# Patient Record
Sex: Female | Born: 1960
Health system: Southern US, Community
[De-identification: ages and names within clinical notes are randomized; demographics above are authoritative.]

## PROBLEM LIST (undated history)

## (undated) DIAGNOSIS — K219 Gastro-esophageal reflux disease without esophagitis: Secondary | ICD-10-CM

## (undated) DIAGNOSIS — N979 Female infertility, unspecified: Secondary | ICD-10-CM

## (undated) DIAGNOSIS — T7840XA Allergy, unspecified, initial encounter: Secondary | ICD-10-CM

## (undated) HISTORY — PX: ECTOPIC PREGNANCY SURGERY: SHX613

## (undated) HISTORY — PX: CHOLECYSTECTOMY: SHX55

## (undated) HISTORY — DX: Female infertility, unspecified: N97.9

## (undated) HISTORY — DX: Allergy, unspecified, initial encounter: T78.40XA

## (undated) HISTORY — PX: REFRACTIVE SURGERY: SHX103

## (undated) HISTORY — DX: Gastro-esophageal reflux disease without esophagitis: K21.9

## (undated) HISTORY — PX: UTERINE FIBROID SURGERY: SHX826

---

## 1997-06-11 ENCOUNTER — Ambulatory Visit (HOSPITAL_COMMUNITY): Admission: RE | Admit: 1997-06-11 | Discharge: 1997-06-11 | Payer: Self-pay | Admitting: Gynecology

## 1997-10-02 ENCOUNTER — Other Ambulatory Visit: Admission: RE | Admit: 1997-10-02 | Discharge: 1997-10-02 | Payer: Self-pay | Admitting: Gynecology

## 1998-10-08 ENCOUNTER — Other Ambulatory Visit: Admission: RE | Admit: 1998-10-08 | Discharge: 1998-10-08 | Payer: Self-pay | Admitting: Gynecology

## 1999-09-03 ENCOUNTER — Other Ambulatory Visit: Admission: RE | Admit: 1999-09-03 | Discharge: 1999-09-03 | Payer: Self-pay | Admitting: Gynecology

## 2001-02-12 ENCOUNTER — Other Ambulatory Visit: Admission: RE | Admit: 2001-02-12 | Discharge: 2001-02-12 | Payer: Self-pay | Admitting: Gynecology

## 2003-05-16 ENCOUNTER — Other Ambulatory Visit: Admission: RE | Admit: 2003-05-16 | Discharge: 2003-05-16 | Payer: Self-pay | Admitting: Gynecology

## 2003-09-19 ENCOUNTER — Encounter: Admission: RE | Admit: 2003-09-19 | Discharge: 2003-09-19 | Payer: Self-pay | Admitting: Family Medicine

## 2003-09-19 ENCOUNTER — Encounter: Payer: Self-pay | Admitting: Internal Medicine

## 2005-06-24 ENCOUNTER — Other Ambulatory Visit: Admission: RE | Admit: 2005-06-24 | Discharge: 2005-06-24 | Payer: Self-pay | Admitting: Gynecology

## 2005-08-26 ENCOUNTER — Ambulatory Visit: Payer: Self-pay | Admitting: Gastroenterology

## 2006-03-24 ENCOUNTER — Ambulatory Visit: Payer: Self-pay | Admitting: Gastroenterology

## 2006-04-09 ENCOUNTER — Ambulatory Visit: Payer: Self-pay | Admitting: Gastroenterology

## 2006-06-09 ENCOUNTER — Ambulatory Visit: Payer: Self-pay | Admitting: Internal Medicine

## 2006-06-25 ENCOUNTER — Ambulatory Visit: Payer: Self-pay | Admitting: Internal Medicine

## 2006-06-25 LAB — CONVERTED CEMR LAB
Albumin: 4.1 g/dL (ref 3.5–5.2)
BUN: 7 mg/dL (ref 6–23)
Basophils Absolute: 0 10*3/uL (ref 0.0–0.1)
Cholesterol: 206 mg/dL (ref 0–200)
Creatinine, Ser: 0.9 mg/dL (ref 0.4–1.2)
Direct LDL: 126.7 mg/dL
Eosinophils Absolute: 0.1 10*3/uL (ref 0.0–0.6)
GFR calc Af Amer: 88 mL/min
HCT: 38.7 % (ref 36.0–46.0)
Hemoglobin: 13.1 g/dL (ref 12.0–15.0)
Lymphocytes Relative: 21.6 % (ref 12.0–46.0)
MCHC: 33.8 g/dL (ref 30.0–36.0)
MCV: 88.1 fL (ref 78.0–100.0)
Monocytes Absolute: 0.7 10*3/uL (ref 0.2–0.7)
Monocytes Relative: 7.9 % (ref 3.0–11.0)
Neutro Abs: 5.7 10*3/uL (ref 1.4–7.7)
Neutrophils Relative %: 69.2 % (ref 43.0–77.0)
Potassium: 4.7 meq/L (ref 3.5–5.1)
RDW: 12 % (ref 11.5–14.6)
Sodium: 149 meq/L — ABNORMAL HIGH (ref 135–145)
TSH: 2.7 microintl units/mL (ref 0.35–5.50)
Total Bilirubin: 0.8 mg/dL (ref 0.3–1.2)

## 2006-08-26 ENCOUNTER — Other Ambulatory Visit: Admission: RE | Admit: 2006-08-26 | Discharge: 2006-08-26 | Payer: Self-pay | Admitting: Gynecology

## 2006-09-21 ENCOUNTER — Encounter: Admission: RE | Admit: 2006-09-21 | Discharge: 2006-09-21 | Payer: Self-pay | Admitting: Gynecology

## 2007-05-05 ENCOUNTER — Telehealth: Payer: Self-pay | Admitting: Internal Medicine

## 2007-11-08 ENCOUNTER — Encounter: Admission: RE | Admit: 2007-11-08 | Discharge: 2007-11-08 | Payer: Self-pay | Admitting: Gynecology

## 2007-12-01 ENCOUNTER — Other Ambulatory Visit: Admission: RE | Admit: 2007-12-01 | Discharge: 2007-12-01 | Payer: Self-pay | Admitting: Gynecology

## 2008-05-07 ENCOUNTER — Encounter: Payer: Self-pay | Admitting: Internal Medicine

## 2008-08-03 ENCOUNTER — Telehealth: Payer: Self-pay | Admitting: Internal Medicine

## 2008-10-22 ENCOUNTER — Ambulatory Visit: Payer: Self-pay | Admitting: Family Medicine

## 2008-10-22 DIAGNOSIS — R5381 Other malaise: Secondary | ICD-10-CM | POA: Insufficient documentation

## 2008-10-22 DIAGNOSIS — R5383 Other fatigue: Secondary | ICD-10-CM

## 2008-10-22 DIAGNOSIS — J069 Acute upper respiratory infection, unspecified: Secondary | ICD-10-CM | POA: Insufficient documentation

## 2008-10-22 DIAGNOSIS — K219 Gastro-esophageal reflux disease without esophagitis: Secondary | ICD-10-CM | POA: Insufficient documentation

## 2008-10-25 LAB — CONVERTED CEMR LAB
ALT: 12 units/L (ref 0–35)
Alkaline Phosphatase: 50 units/L (ref 39–117)
Basophils Relative: 0.4 % (ref 0.0–3.0)
Bilirubin, Direct: 0 mg/dL (ref 0.0–0.3)
Calcium: 9.2 mg/dL (ref 8.4–10.5)
Chloride: 105 meq/L (ref 96–112)
Creatinine, Ser: 0.7 mg/dL (ref 0.4–1.2)
Eosinophils Relative: 1.1 % (ref 0.0–5.0)
Hemoglobin: 13.1 g/dL (ref 12.0–15.0)
Lymphocytes Relative: 29.9 % (ref 12.0–46.0)
MCV: 89.1 fL (ref 78.0–100.0)
Neutrophils Relative %: 57.6 % (ref 43.0–77.0)
RBC: 4.29 M/uL (ref 3.87–5.11)
Sodium: 142 meq/L (ref 135–145)
Total Protein: 7.5 g/dL (ref 6.0–8.3)
WBC: 6.5 10*3/uL (ref 4.5–10.5)

## 2009-01-02 ENCOUNTER — Ambulatory Visit: Payer: Self-pay | Admitting: Internal Medicine

## 2009-01-02 DIAGNOSIS — R1011 Right upper quadrant pain: Secondary | ICD-10-CM | POA: Insufficient documentation

## 2009-01-02 DIAGNOSIS — J310 Chronic rhinitis: Secondary | ICD-10-CM | POA: Insufficient documentation

## 2009-01-02 DIAGNOSIS — G478 Other sleep disorders: Secondary | ICD-10-CM | POA: Insufficient documentation

## 2009-01-04 ENCOUNTER — Encounter: Admission: RE | Admit: 2009-01-04 | Discharge: 2009-01-04 | Payer: Self-pay | Admitting: Internal Medicine

## 2009-01-29 ENCOUNTER — Ambulatory Visit (HOSPITAL_COMMUNITY): Admission: RE | Admit: 2009-01-29 | Discharge: 2009-01-29 | Payer: Self-pay | Admitting: Internal Medicine

## 2009-04-08 ENCOUNTER — Encounter: Payer: Self-pay | Admitting: Internal Medicine

## 2009-05-01 ENCOUNTER — Observation Stay (HOSPITAL_COMMUNITY): Admission: RE | Admit: 2009-05-01 | Discharge: 2009-05-02 | Payer: Self-pay | Admitting: General Surgery

## 2009-05-02 ENCOUNTER — Encounter (INDEPENDENT_AMBULATORY_CARE_PROVIDER_SITE_OTHER): Payer: Self-pay | Admitting: General Surgery

## 2009-08-27 ENCOUNTER — Encounter: Admission: RE | Admit: 2009-08-27 | Discharge: 2009-08-27 | Payer: Self-pay | Admitting: Gynecology

## 2010-03-29 ENCOUNTER — Encounter: Payer: Self-pay | Admitting: Family Medicine

## 2010-04-10 NOTE — Consult Note (Signed)
Summary: [facility]   Imported By: Maryln Gottron 04/23/2009 11:25:34  _____________________________________________________________________  External Attachment:    Type:   Image     Comment:   External Document

## 2010-05-01 ENCOUNTER — Encounter: Payer: Self-pay | Admitting: Internal Medicine

## 2010-05-02 ENCOUNTER — Encounter: Payer: Self-pay | Admitting: Internal Medicine

## 2010-05-02 ENCOUNTER — Ambulatory Visit (INDEPENDENT_AMBULATORY_CARE_PROVIDER_SITE_OTHER): Payer: Managed Care, Other (non HMO) | Admitting: Internal Medicine

## 2010-05-02 VITALS — BP 120/80 | HR 66 | Temp 98.7°F | Wt 122.0 lb

## 2010-05-02 DIAGNOSIS — Z1211 Encounter for screening for malignant neoplasm of colon: Secondary | ICD-10-CM | POA: Insufficient documentation

## 2010-05-02 DIAGNOSIS — K219 Gastro-esophageal reflux disease without esophagitis: Secondary | ICD-10-CM

## 2010-05-02 DIAGNOSIS — N979 Female infertility, unspecified: Secondary | ICD-10-CM

## 2010-05-02 DIAGNOSIS — Z23 Encounter for immunization: Secondary | ICD-10-CM

## 2010-05-02 DIAGNOSIS — Z7184 Encounter for health counseling related to travel: Secondary | ICD-10-CM

## 2010-05-02 MED ORDER — CIPROFLOXACIN HCL 500 MG PO TABS: 500.0000 mg | ORAL_TABLET | Freq: Two times a day (BID) | ORAL | Status: AC

## 2010-05-02 NOTE — Patient Instructions (Signed)
I will look into  The malaria prophylaxis and you should call  So we can   email or call in rx   Usually started 1-2 weeks pre travel.   Also we can give the tdap if needed 0

## 2010-05-04 ENCOUNTER — Encounter: Payer: Self-pay | Admitting: Internal Medicine

## 2010-05-04 DIAGNOSIS — N979 Female infertility, unspecified: Secondary | ICD-10-CM | POA: Insufficient documentation

## 2010-05-04 NOTE — Assessment & Plan Note (Signed)
Currently taking meds prn  And  Controlled otherwise

## 2010-05-04 NOTE — Progress Notes (Signed)
  Subjective:    Patient ID: [name] May, female    DOB: 1960-11-16, 50 y.o.   MRN: [id number]  HPI Patient comes in today  To discuss travel med issues. To go to [place]  And [place]  June 2 through 16th 2012 to stay mostly in hotels. Has read on CDC web site about rec. Unsure when had last tdap  Poss here pre electronics  ( before 2008 )  Never had hep a  Vaccine or disease. Healthy at present  ? About malaria prophylaxis.    To look into Yellow fever and  typhoid vaccine  At travel clinic. Past Medical History  Diagnosis Date  . GERD (gastroesophageal reflux disease)   . Infertility, female     Dr Chevis Pretty G1P0   Past Surgical History  Procedure Date  . Ectopic pregnancy surgery     laproscopy  . Uterine poly     reports that she has quit smoking. She does not have any smokeless tobacco history on file. She reports that she drinks alcohol. She reports that she does not use illicit drugs. family history includes Alcohol abuse in an unspecified family member; Coronary artery disease in an unspecified family member; Heart disease in her father; Pulmonary fibrosis in her mother; and Thyroid disease in her brother and sister.  There is no history of Gallbladder disease.    Review of Systems Prn meds for gerd.  Had uterine polyp removed recently per Dr Chevis Pretty.     Objective:   Physical Exam WD WN in nad  Looks healthy   Oriented x 3 and no noted deficits in memory, attention, and speech.      Assessment & Plan:  Travel  Advice   rx given for travelers diarrhea  Review of Electronic  Records show no td here  She will check her records  To see when done and call if needed Begin Twin rix series Counseled about various malaria preventions   Call near trip time about which way she wants to go.  More than 50% of visit  Was spent in counseling  15 minutes .

## 2010-05-29 LAB — CBC
HCT: 39.3 % (ref 36.0–46.0)
MCHC: 34.6 g/dL (ref 30.0–36.0)
MCV: 88.8 fL (ref 78.0–100.0)
Platelets: 171 10*3/uL (ref 150–400)
RDW: 12.5 % (ref 11.5–15.5)

## 2010-05-29 LAB — COMPREHENSIVE METABOLIC PANEL
Albumin: 4.4 g/dL (ref 3.5–5.2)
BUN: 11 mg/dL (ref 6–23)
Chloride: 102 mEq/L (ref 96–112)
Creatinine, Ser: 0.57 mg/dL (ref 0.4–1.2)
Total Bilirubin: 0.7 mg/dL (ref 0.3–1.2)
Total Protein: 7.4 g/dL (ref 6.0–8.3)

## 2010-05-29 LAB — DIFFERENTIAL
Basophils Absolute: 0 10*3/uL (ref 0.0–0.1)
Lymphocytes Relative: 27 % (ref 12–46)
Monocytes Absolute: 0.6 10*3/uL (ref 0.1–1.0)
Neutro Abs: 4.5 10*3/uL (ref 1.7–7.7)

## 2010-06-18 ENCOUNTER — Ambulatory Visit: Payer: Managed Care, Other (non HMO) | Admitting: Internal Medicine

## 2010-07-17 ENCOUNTER — Ambulatory Visit: Payer: Managed Care, Other (non HMO) | Admitting: Internal Medicine

## 2010-07-18 ENCOUNTER — Ambulatory Visit (INDEPENDENT_AMBULATORY_CARE_PROVIDER_SITE_OTHER): Payer: Managed Care, Other (non HMO) | Admitting: Internal Medicine

## 2010-07-18 DIAGNOSIS — Z23 Encounter for immunization: Secondary | ICD-10-CM

## 2010-07-25 NOTE — Assessment & Plan Note (Signed)
[facility] OFFICE NOTE   NAME:[name] May, [name] RUDY                  MRN:          [id number]  DATE:06/09/2006                            DOB:          [birth year]    CHIEF COMPLAINT:  New patient to get established with a couple of  issues.   HISTORY OF PRESENT ILLNESS:  Ms. [name] [name] May is a 50 year old, nonsmoking,  white female who comes in today for a first time visit.  Her previous  care was through [facility] and her GYN is Dr. Grover [name] May.  She also has been seen in [facility].  She is changing primary  care for a number of reasons.  She is generally well but does have a  diagnosis of GERD, diagnosed by endoscopy in April 11, 2006.  She is  now on Nexium with good response although at times does take the Nexium  p.r.n.  She does have hiatal hernia but no other unusual findings on her  exam.  The report is not available to [place] today.   History of seasonal rhinitis and hay fever, which tends to be worse in  the winter.  However, it is pretty much controlled on Zyrtec and  Nasonex.  No history of asthma.   PREVENTIVE:  She is up to date on her Pap smear and mammogram.  She does  not know when her last tetanus shot is.   PAST MEDICAL HISTORY:  See database.  Ectopic pregnancy in 1999.  She is  gravida 1, para 0.  LMP of May 06, 2006.   MEDICATIONS:  1. Nexium 40 mg a day.  2. Zyrtec as needed.  3. Nasonex as needed.   DRUG INTOLERANCES:  ERYTHROMYCIN.   FAMILY HISTORY:  Father had a CABG x4, died at age 41.  Mother died in  04/11/2006 of idiopathic pulmonary fibrosis, age 44.  A maternal  grandmother had possibly cervical cancer.  Father had problems with  alcohol.   SOCIAL HISTORY:  Household of four.  Rare alcohol.  No tobacco.  Two  caffeinated beverages a day.  Trying to eat more healthy and exercise.  Pets:  Two cats and a dog.  See database.  Six to eight hours of  sleep.   REVIEW OF SYSTEMS:  Negative for chest pain, shortness of breath.  She  does have a history of recurrent ovarian cysts and has fibroids, which  sometimes are problematic.  Otherwise, rest as per HPI.   OBJECTIVE:  VITAL SIGNS:  Height 5 feet 1.5 inches.  Weight 125.  Pulse  66 and regular.  Blood pressure 100/60.  GENERAL:  A healthy-appearing lady in no acute distress.  HEENT:  Grossly normal with minimal congestion.  NECK:  Supple without mass, thyromegaly, or bruits.  CHEST:  Clear to auscultation and percussion.  Breath sounds equal.  CARDIAC:  S1, S2.  No gallops or murmurs are heard.  EXTREMITIES:  Peripheral pulses present without delay.  Negative  cyanosis, clubbing, or edema.  ABDOMEN:  Soft.  No hepatosplenomegaly, guarding or rebound.  NEUROLOGIC:  Appears  grossly intact.   IMPRESSION:  1. Gastroesophageal reflux disease, controlled on medication.  Can      refill her Nexium and discuss other options such as an H2      antagonist and lifestyle changes.  2. Allergic rhinitis.  Discuss options for this treatment and we will      refill her Nasonex.  3. Family history of cardiac disease and some questionable history of      elevated lipids in her personally.  No records.  We will get today.      We will check lipids with CPX lab, report these to her and      intervene as appropriate.  Reviewed healthy lifestyles to continue.      She is to get a Tdap booster today.  Follow up other needs as      appropriate.   ADDITIONAL HISTORY:  She does have a history of irritable bowel  syndrome.     Neta Mends. Panosh, MD  Electronically Signed    WKP/MedQ  DD: 06/09/2006  DT: 06/10/2006  Job #: [id number]

## 2013-04-07 ENCOUNTER — Ambulatory Visit: Payer: Managed Care, Other (non HMO) | Admitting: Sports Medicine

## 2014-10-01 LAB — HM PAP SMEAR: HM Pap smear: NEGATIVE

## 2016-05-18 ENCOUNTER — Ambulatory Visit: Payer: Managed Care, Other (non HMO) | Admitting: Physician Assistant

## 2016-05-19 ENCOUNTER — Ambulatory Visit (INDEPENDENT_AMBULATORY_CARE_PROVIDER_SITE_OTHER): Payer: Commercial Managed Care - PPO | Admitting: Physician Assistant

## 2016-05-19 ENCOUNTER — Ambulatory Visit (INDEPENDENT_AMBULATORY_CARE_PROVIDER_SITE_OTHER): Payer: Commercial Managed Care - PPO

## 2016-05-19 ENCOUNTER — Ambulatory Visit (INDEPENDENT_AMBULATORY_CARE_PROVIDER_SITE_OTHER): Payer: Commercial Managed Care - PPO | Admitting: Sports Medicine

## 2016-05-19 ENCOUNTER — Encounter: Payer: Self-pay | Admitting: Physician Assistant

## 2016-05-19 VITALS — BP 147/77 | HR 68 | Wt 130.0 lb

## 2016-05-19 DIAGNOSIS — R030 Elevated blood-pressure reading, without diagnosis of hypertension: Secondary | ICD-10-CM

## 2016-05-19 DIAGNOSIS — M25551 Pain in right hip: Secondary | ICD-10-CM

## 2016-05-19 DIAGNOSIS — G8929 Other chronic pain: Secondary | ICD-10-CM

## 2016-05-19 DIAGNOSIS — Z0000 Encounter for general adult medical examination without abnormal findings: Secondary | ICD-10-CM

## 2016-05-19 DIAGNOSIS — M545 Low back pain: Secondary | ICD-10-CM

## 2016-05-19 DIAGNOSIS — M25552 Pain in left hip: Secondary | ICD-10-CM | POA: Insufficient documentation

## 2016-05-19 MED ORDER — MELOXICAM 15 MG PO TABS
ORAL_TABLET | ORAL | 3 refills | Status: DC
Start: 2016-05-19 — End: 2017-10-21

## 2016-05-19 NOTE — Assessment & Plan Note (Signed)
Vague hip pain, likely due to lumbar spondylosis versus femoral neck stress injury. X-rays of the lumbar spine, hip, meloxicam, lumbar spine rehabilitation exercises given. If no better in one month we will proceed with MRI of the lumbar spine and the hip.

## 2016-05-19 NOTE — Patient Instructions (Addendum)
Physical Activity Recommendations for modifying lipids and lowering blood pressure Engage in aerobic physical activity to reduce LDL-cholesterol, non-HDL-cholesterol, and blood pressure  Frequency: 3-4 sessions per week  Intensity: moderate to vigorous  Duration: 40 minutes on average  Physical Activity Recommendations for secondary prevention 1. Aerobic exercise  Frequency: 3-5 sessions per week  Intensity: 50-80% capacity  Duration: 20 - 60 minutes  Examples: walking, treadmill, cycling, rowing, stair climbing, and arm/leg ergometry  2. Resistance exercise  Frequency: 2-3 sessions per week  Intensity: 10-15 repetitions/set to moderate fatigue  Duration: 1-3 sets of 8-10 upper and lower body exercises  Examples: calisthenics, elastic bands, cuff/hand weights, dumbbels, free weights, wall pulleys, and weight machines  Heart-Healthy Lifestyle  Eating a diet rich in vegetables, fruits and whole grains: also includes low-fat dairy products, poultry, fish, legumes, and nuts; limit intake of sweets, sugar-sweetened beverages and red meats  Getting regular exercise  Maintaining a healthy weight  Not smoking or getting help quitting  Staying on top of your health; for some people, lifestyle changes alone may not be enough to prevent a heart attack or stroke. In these cases, taking a statin at the right dose will most likely be necessary   How to Take Your Blood Pressure Blood pressure is a measurement of how strongly your blood is pressing against the walls of your arteries. Arteries are blood vessels that carry blood from your heart throughout your body. Your health care provider takes your blood pressure at each office visit. You can also take your own blood pressure at home with a blood pressure machine. You may need to take your own blood pressure:  To confirm a diagnosis of high blood pressure (hypertension).  To monitor your blood pressure over time.  To make sure  your blood pressure medicine is working. Supplies needed: To take your blood pressure, you will need a blood pressure machine. You can buy a blood pressure machine, or blood pressure monitor, at most drugstores or online. There are several types of home blood pressure monitors. When choosing one, consider the following:  Choose a monitor that has an arm cuff.  Choose a monitor that wraps snugly around your upper arm. You should be able to fit only one finger between your arm and the cuff.  Do not choose a monitor that measures your blood pressure from your wrist or finger. Your health care provider can suggest a reliable monitor that will meet your needs. How to prepare To get the most accurate reading, avoid the following for 30 minutes before you check your blood pressure:  Drinking caffeine.  Drinking alcohol.  Eating.  Smoking.  Exercising. Five minutes before you check your blood pressure:  Empty your bladder.  Sit quietly without talking in a dining chair, rather than in a soft couch or armchair. How to take your blood pressure To check your blood pressure, follow the instructions in the manual that came with your blood pressure monitor. If you have a digital blood pressure monitor, the instructions may be as follows: 1. Sit up straight. 2. Place your feet on the floor. Do not cross your ankles or legs. 3. Rest your left arm at the level of your heart on a table or desk or on the arm of a chair. 4. Pull up your shirt sleeve. 5. Wrap the blood pressure cuff around the upper part of your left arm, 1 inch (2.5 cm) above your elbow. It is best to wrap the cuff around bare skin. 6.  Fit the cuff snugly around your arm. You should be able to place only one finger between the cuff and your arm. 7. Position the cord inside the groove of your elbow. 8. Press the power button. 9. Sit quietly while the cuff inflates and deflates. 10. Read the digital reading on the monitor screen and  write it down (record it). 11. Wait 2-3 minutes, then repeat the steps, starting at step 1. What does my blood pressure reading mean? A blood pressure reading consists of a higher number over a lower number. Ideally, your blood pressure should be below 120/80. The first ("top") number is called the systolic pressure. It is a measure of the pressure in your arteries as your heart beats. The second ("bottom") number is called the diastolic pressure. It is a measure of the pressure in your arteries as the heart relaxes. Blood pressure is classified into four stages. The following are the stages for adults who do not have a short-term serious illness or a chronic condition. Systolic pressure and diastolic pressure are measured in a unit called mm Hg. Normal   Systolic pressure: below 416.  Diastolic pressure: below 80. Elevated   Systolic pressure: 606-301.  Diastolic pressure: below 80. Hypertension stage 1   Systolic pressure: 601-093.  Diastolic pressure: 23-55. Hypertension stage 2   Systolic pressure: 732 or above.  Diastolic pressure: 90 or above. You can have prehypertension or hypertension even if only the systolic or only the diastolic number in your reading is higher than normal. Follow these instructions at home:  Check your blood pressure as often as recommended by your health care provider.  Take your monitor to the next appointment with your health care provider to make sure:  That you are using it correctly.  That it provides accurate readings.  Be sure you understand what your goal blood pressure numbers are.  Tell your health care provider if you are having any side effects from blood pressure medicine. Contact a health care provider if:  Your blood pressure is consistently high. Get help right away if:  Your systolic blood pressure is higher than 180.  Your diastolic blood pressure is higher than 110. This information is not intended to replace advice  given to you by your health care provider. Make sure you discuss any questions you have with your health care provider. Document Released: 08/02/2015 Document Revised: 10/15/2015 Document Reviewed: 08/02/2015 Elsevier Interactive Patient Education  2017 [employer].

## 2016-05-19 NOTE — Progress Notes (Signed)
   Subjective:    I'm seeing this patient as a consultation for:  Nelson Chimes, PA-C  CC: Right hip pain  HPI: This is a pleasant 56 year old female, for months she's had vague pain in her right back, lateral hip. No mechanical symptoms, no trauma, no constitutional symptoms. Pain is moderate, persistent. Radiates to the lateral thigh but not past the knee. Nothing overtly radicular. Worse with sitting and driving for a long period of time, not necessarily worse with Valsalva, no saddle numbness, no bowel or bladder dysfunction.  Past medical history:  Negative.  See flowsheet/record as well for more information.  Surgical history: Negative.  See flowsheet/record as well for more information.  Family history: Negative.  See flowsheet/record as well for more information.  Social history: Negative.  See flowsheet/record as well for more information.  Allergies, and medications have been entered into the medical record, reviewed, and no changes needed.   Review of Systems: No headache, visual changes, nausea, vomiting, diarrhea, constipation, dizziness, abdominal pain, skin rash, fevers, chills, night sweats, weight loss, swollen lymph nodes, body aches, joint swelling, muscle aches, chest pain, shortness of breath, mood changes, visual or auditory hallucinations.   Objective:   General: Well Developed, well nourished, and in no acute distress.  Neuro/Psych: Alert and oriented x3, extra-ocular muscles intact, able to move all 4 extremities, sensation grossly intact. Skin: Warm and dry, no rashes noted.  Respiratory: Not using accessory muscles, speaking in full sentences, trachea midline.  Cardiovascular: Pulses palpable, no extremity edema. Abdomen: Does not appear distended. Right Hip: ROM IR: 60 Deg, ER: 60 Deg, Flexion: 120 Deg, Extension: 100 Deg, Abduction: 45 Deg, Adduction: 45 Deg Strength IR: 5/5, ER: 5/5, Flexion: 5/5, Extension: 5/5, Abduction: 5/5, Adduction: 5/5 Pelvic  alignment unremarkable to inspection and palpation. Standing hip rotation and gait without trendelenburg / unsteadiness. Greater trochanter without tenderness to palpation. No tenderness over piriformis. No SI joint tenderness and normal minimal SI movement. Minimal pain with percussion of the heel and the right Back Exam:  Inspection: Unremarkable  Motion: Flexion 45 deg, Extension 45 deg, Side Bending to 45 deg bilaterally,  Rotation to 45 deg bilaterally  SLR laying: Negative  XSLR laying: Negative  Palpable tenderness: None. FABER: negative. Sensory change: Gross sensation intact to all lumbar and sacral dermatomes.  Reflexes: 2+ at both patellar tendons, 2+ at achilles tendons, Babinski's downgoing.  Strength at foot  Plantar-flexion: 5/5 Dorsi-flexion: 5/5 Eversion: 5/5 Inversion: 5/5  Leg strength  Quad: 5/5 Hamstring: 5/5 Hip flexor: 5/5 Hip abductors: 5/5  Gait unremarkable.  Impression and Recommendations:   This case required medical decision making of moderate complexity.  Right hip pain Vague hip pain, likely due to lumbar spondylosis versus femoral neck stress injury. X-rays of the lumbar spine, hip, meloxicam, lumbar spine rehabilitation exercises given. If no better in one month we will proceed with MRI of the lumbar spine and the hip.

## 2016-05-19 NOTE — Progress Notes (Signed)
HPI:                                                                [name] May is a 56 y.o. female who presents to [facility] today to establish care   Current Concerns include right hip pain  Hip pain: began approx 1 year. Gradual onset. Pain is daily and described as "uncomfortable." Worse with certain motions such as crossing her legs and driving. Improves with walking. She has tried occasional Ibuprofen. No known injury or trauma.   Patient reports Mammogram, Pap, and Tdap are up to date. She has never had colon cancer screening.  Health Maintenance Health Maintenance  Topic Date Due  . Hepatitis C Screening  1961-01-14  . HIV Screening  01/02/1978  . TETANUS/TDAP  01/02/1982  . PAP SMEAR  01/03/1984  . MAMMOGRAM  01/02/2013  . COLONOSCOPY  01/02/2013  . INFLUENZA VACCINE  10/08/2015    [facility]  Menstrual status: having periods  LMP: 05/07/16  Menses: regular  Last pap smear: 2016  History of abnormal pap smears: ASCUS x 1  Sexually active: yes  Current contraception: vasectomy  Health Habits  Diet: good  Exercise: pilates and walking  ETOH: yes, socially  Tobacco: no, smoked cigarettes as a teenager  Drugs: no   Past Medical History:  Diagnosis Date  . GERD (gastroesophageal reflux disease)   . Hyperlipidemia   . Infertility, female    Dr Carren Rang G1P0   Past Surgical History:  Procedure Laterality Date  . CHOLECYSTECTOMY    . ECTOPIC PREGNANCY SURGERY     laproscopy  . UTERINE FIBROID SURGERY     polyp   Social History  Substance Use Topics  . Smoking status: Former Smoker    Packs/day: 0.25    Years: 2.00    Types: Cigarettes    Quit date: 05/20/1983  . Smokeless tobacco: Never Used  . Alcohol use Yes   family history includes Heart disease in her father; Pulmonary fibrosis in her mother; Thyroid disease in her brother and sister.  ROS: negative  except as noted in the HPI  Medications: Current Outpatient Prescriptions  Medication Sig Dispense Refill  . diphenhydrAMINE (BENADRYL) 25 mg capsule Take 25 mg by mouth at bedtime as needed.    . Melatonin 1 MG TABS Take 1 mg by mouth at bedtime.    . Multiple Vitamin (MULTIVITAMIN) capsule Take 1 capsule by mouth daily.    . meloxicam (MOBIC) 15 MG tablet One tab PO qAM with breakfast for 2 weeks, then daily prn pain. 30 tablet 3   No current facility-administered medications for this visit.    No Known Allergies     Objective:  BP (!) 147/77 (BP Location: Right Arm, Cuff Size: Normal)   Pulse 68   Wt 130 lb (59 kg)   LMP 05/07/2016   BMI 24.17 kg/m  Gen: well-groomed, cooperative, not ill-appearing, no distress HEENT: normal conjunctiva, TM's clear, oropharynx clear, moist mucus membranes, no thyromegaly or tenderness Pulm: Normal work of breathing, clear to auscultation bilaterally CV: Normal rate, regular rhythm, s1 and s2 distinct, no murmurs, clicks or rubs appreciated on this exam, no carotid bruit GI: soft, nondistended, nontender, no masses  Neuro: alert and oriented x 3, EOM's intact, PERRLA, DTR's intact MSK: strength 5/5 and symmetric, normal gait and station, distal pulses intact, no peripheral edema Skin: warm and dry, no rashes or lesions on exposed skin Psych: normal affect, pleasant mood, normal speech and thought content  Depression screen PHQ 2/9 05/19/2016  Decreased Interest 0  Down, Depressed, Hopeless 0  PHQ - 2 Score 0      Assessment and Plan: 56 y.o. female with  1. Encounter for routine history and physical exam in female - CBC - COMPLETE METABOLIC PANEL WITH GFR - Lipid Panel w/reflex Direct LDL - Hepatitis C antibody - Mammogram and Pap up to date: patient requesting outside records - discussed Cologuard. Patient is interested. Wants to contact insurance company about copay  2. Elevated blood pressure reading - risk factors include  hyperlipidemia. Family hx of heart disease in her father. Nonsmoker, no diabetes. She is asymptomatic. - instructed on how to check BP's at home - limit salt. DASH eating plan - follow-up in 4 weeks with home BP readings  3. Chronic right hip pain - consulted Dr. Dianah Field, Sports Medicine (see note)  Patient education and anticipatory guidance given Patient agrees with treatment plan Follow-up in 4 weeks for blood pressure or sooner as needed  Darlyne Russian PA-C

## 2016-05-20 ENCOUNTER — Encounter: Payer: Self-pay | Admitting: Physician Assistant

## 2016-05-20 DIAGNOSIS — E782 Mixed hyperlipidemia: Secondary | ICD-10-CM | POA: Insufficient documentation

## 2016-05-20 LAB — LIPID PANEL W/REFLEX DIRECT LDL
CHOL/HDL RATIO: 3.5 ratio (ref <5.0)
CHOLESTEROL: 209 mg/dL — AB (ref <200)
HDL: 60 mg/dL (ref >50)
LDL-Cholesterol: 116 mg/dL — ABNORMAL HIGH
Non-HDL Cholesterol (Calc): 149 mg/dL — ABNORMAL HIGH (ref <130)
Triglycerides: 211 mg/dL — ABNORMAL HIGH (ref <150)

## 2016-05-20 LAB — CBC
HEMATOCRIT: 39.9 % (ref 35.0–45.0)
HEMOGLOBIN: 12.9 g/dL (ref 11.7–15.5)
MCH: 29.1 pg (ref 27.0–33.0)
MCHC: 32.3 g/dL (ref 32.0–36.0)
MCV: 90.1 fL (ref 80.0–100.0)
MPV: 11.4 fL (ref 7.5–12.5)
Platelets: 215 10*3/uL (ref 140–400)
RBC: 4.43 MIL/uL (ref 3.80–5.10)
RDW: 12.7 % (ref 11.0–15.0)
WBC: 6.1 10*3/uL (ref 3.8–10.8)

## 2016-05-20 LAB — HEPATITIS C ANTIBODY: HCV Ab: NEGATIVE

## 2016-05-20 LAB — COMPLETE METABOLIC PANEL WITH GFR
ALBUMIN: 4.3 g/dL (ref 3.6–5.1)
ALK PHOS: 48 U/L (ref 33–130)
ALT: 14 U/L (ref 6–29)
AST: 14 U/L (ref 10–35)
BILIRUBIN TOTAL: 0.3 mg/dL (ref 0.2–1.2)
BUN: 13 mg/dL (ref 7–25)
CALCIUM: 9.4 mg/dL (ref 8.6–10.4)
CO2: 27 mmol/L (ref 20–31)
Chloride: 102 mmol/L (ref 98–110)
Creat: 0.75 mg/dL (ref 0.50–1.05)
GFR, Est African American: >89 mL/min (ref >=60)
Glucose, Bld: 82 mg/dL (ref 65–99)
POTASSIUM: 3.8 mmol/L (ref 3.5–5.3)
Sodium: 138 mmol/L (ref 135–146)
TOTAL PROTEIN: 7.2 g/dL (ref 6.1–8.1)

## 2016-06-16 ENCOUNTER — Ambulatory Visit: Payer: Commercial Managed Care - PPO | Admitting: Physician Assistant

## 2016-06-16 ENCOUNTER — Ambulatory Visit: Payer: Commercial Managed Care - PPO | Admitting: Sports Medicine

## 2016-09-07 DIAGNOSIS — L821 Other seborrheic keratosis: Secondary | ICD-10-CM | POA: Diagnosis not present

## 2016-09-07 DIAGNOSIS — D485 Neoplasm of uncertain behavior of skin: Secondary | ICD-10-CM | POA: Diagnosis not present

## 2017-10-21 ENCOUNTER — Ambulatory Visit (INDEPENDENT_AMBULATORY_CARE_PROVIDER_SITE_OTHER): Payer: Commercial Managed Care - PPO | Admitting: Physician Assistant

## 2017-10-21 ENCOUNTER — Encounter: Payer: Self-pay | Admitting: Physician Assistant

## 2017-10-21 VITALS — BP 116/70 | HR 63 | Resp 14 | Wt 123.0 lb

## 2017-10-21 DIAGNOSIS — Z130 Encounter for screening for diseases of the blood and blood-forming organs and certain disorders involving the immune mechanism: Secondary | ICD-10-CM | POA: Diagnosis not present

## 2017-10-21 DIAGNOSIS — Z1231 Encounter for screening mammogram for malignant neoplasm of breast: Secondary | ICD-10-CM

## 2017-10-21 DIAGNOSIS — Z131 Encounter for screening for diabetes mellitus: Secondary | ICD-10-CM

## 2017-10-21 DIAGNOSIS — Z13220 Encounter for screening for lipoid disorders: Secondary | ICD-10-CM | POA: Diagnosis not present

## 2017-10-21 DIAGNOSIS — Z1329 Encounter for screening for other suspected endocrine disorder: Secondary | ICD-10-CM

## 2017-10-21 DIAGNOSIS — N951 Menopausal and female climacteric states: Secondary | ICD-10-CM | POA: Insufficient documentation

## 2017-10-21 DIAGNOSIS — L570 Actinic keratosis: Secondary | ICD-10-CM | POA: Insufficient documentation

## 2017-10-21 DIAGNOSIS — Z1211 Encounter for screening for malignant neoplasm of colon: Secondary | ICD-10-CM

## 2017-10-21 DIAGNOSIS — R232 Flushing: Secondary | ICD-10-CM

## 2017-10-21 DIAGNOSIS — Z0000 Encounter for general adult medical examination without abnormal findings: Secondary | ICD-10-CM | POA: Diagnosis not present

## 2017-10-21 DIAGNOSIS — L989 Disorder of the skin and subcutaneous tissue, unspecified: Secondary | ICD-10-CM

## 2017-10-21 NOTE — Progress Notes (Signed)
HPI:                                                                [name] May is a 57 y.o. female who presents to [facility] today for Pap smear only  Current Concerns include: skin lesions  Reports new raised skin lesion on her chest wall present for approx 1 month. Also reports scaly skin lesion on her left cheek present for 1 year. Occasionally pruritic. Denies tenderness or bleeding. Reports she has been told that she has Actinic Keratoses by her dermatologist.   GYN/Sexual Health  Obstetrics: [id number]  Menstrual status: having periods  LMP: 10/07/17  Menses: irregular, intermittent vasomotor symptoms  Last pap smear: 2017, [facility]  History of abnormal pap smears: no  Sexually active: yes  Current contraception: vasectomy  History of STI: no  Depression screen [facility] 2/9 10/21/2017 05/19/2016  Decreased Interest 0 0  Down, Depressed, Hopeless 0 0  PHQ - 2 Score 0 0    Health Maintenance Health Maintenance  Topic Date Due  . INFLUENZA VACCINE  10/10/2018 (Originally 10/07/2017)  . COLONOSCOPY  10/22/2018 (Originally 01/02/2013)  . HIV Screening  05/20/2026 (Originally 01/02/1978)  . MAMMOGRAM  01/02/2018  . PAP SMEAR  01/02/2021  . TETANUS/TDAP  01/02/2025  . Hepatitis C Screening  Completed    Past Medical History:  Diagnosis Date  . Allergy   . GERD (gastroesophageal reflux disease)   . Infertility, female    Dr [name] May G1P0   Past Surgical History:  Procedure Laterality Date  . CHOLECYSTECTOMY    . ECTOPIC PREGNANCY SURGERY     laproscopy  . REFRACTIVE SURGERY    . UTERINE FIBROID SURGERY     polyp   Social History   Tobacco Use  . Smoking status: Former Smoker    Packs/day: 0.25    Years: 2.00    Pack years: 0.50    Types: Cigarettes    Last attempt to quit: 05/20/1983    Years since quitting: 34.4  . Smokeless tobacco: Never Used  Substance Use Topics  . Alcohol  use: Yes    Alcohol/week: 7.0 standard drinks    Types: 7 Glasses of wine per week   family history includes Alcohol abuse in her unknown relative; Collagen disease in her sister; Coronary artery disease in her unknown relative; Heart disease in her father; Pulmonary fibrosis in her mother; Thyroid disease in her brother and sister.  ROS: Review of Systems  Constitutional: Positive for diaphoresis (flushing).  Eyes: Positive for blurred vision (far sighted).  Skin: Positive for itching (scalp).       Skin lesions     Medications: Current Outpatient Medications  Medication Sig Dispense Refill  . diphenhydrAMINE (BENADRYL) 25 mg capsule Take 25 mg by mouth at bedtime as needed.    . Melatonin 1 MG TABS Take 1 mg by mouth at bedtime.    . Multiple Vitamin (MULTIVITAMIN) capsule Take 1 capsule by mouth daily.     No current facility-administered medications for this visit.    No Known Allergies     Objective:  BP 116/70   Pulse 63   Resp 14   Wt 123 lb (55.8 kg)   LMP 10/07/2017 (  Approximate)   BMI 22.86 kg/m  General Appearance:  Alert, cooperative, no distress, appropriate for age                            Head:  Normocephalic, without obvious abnormality                             Eyes:  PERRL, EOM's intact, conjunctiva and cornea clear                             Ears:  TM pearly gray color and semitransparent, external ear canals normal, both ears                            Nose:  Nares symmetrical                          Throat:  Lips, tongue, and mucosa are moist, pink, and intact; good dentition                             Neck:  Supple; symmetrical, trachea midline, no adenopathy; thyroid: no enlargement, symmetric, no tenderness/mass/nodules                             Back:  Symmetrical, no curvature, ROM normal               Chest/Breast:  deferred                           Lungs:  Clear to auscultation bilaterally, respirations unlabored                              Heart:  regular rate & normal rhythm, S1 and S2 normal, no murmurs, rubs, or gallops                     Abdomen:  Soft, non-tender, no mass or organomegaly              Genitourinary:  deferred         Musculoskeletal:  Tone and strength strong and symmetrical, all extremities; no joint pain or edema, normal gait and station                                     Lymphatic:  No adenopathy             Skin/Hair/Nails:  Skin warm, dry and intact, approx 4 mm scaly papule of the upper chest wall with hyperpigmented border at 12 o'clock position; skin tag of left axilla, right cheek with approx 6 mm scaly plaque                   Neurologic:  Alert and oriented x3, no cranial nerve deficits, sensation grossly intact, normal gait and station, no tremor Psych: well-groomed, cooperative, good eye contact, euthymic mood, affect mood-congruent, speech is articulate, and thought processes clear and goal-directed     No results found for this or any previous visit (  from the past 72 hour(s)). No results found.    Assessment and Plan: 57 y.o. female with   .[name] May was seen today for annual exam.  Diagnoses and all orders for this visit:  Encounter for annual physical exam  Perimenopausal  Colon cancer screening Comments: FOBT test given 10/21/17  Screening for lipid disorders -     Lipid Panel w/reflex Direct LDL  Screening for blood disease -     CBC -     Comprehensive metabolic panel  Screening for diabetes mellitus -     Comprehensive metabolic panel  Screening for thyroid disorder -     TSH + free T4  Actinic keratoses  Breast cancer screening by mammogram -     MM 3D SCREEN BREAST BILATERAL; Future  Vasomotor flushing   - Personally reviewed PMH, PSH, PFH, medications, allergies, HM - Age-appropriate cancer screening: Pap UTD per patient, requesting records from [facility] Mammogram due, order placed today; Colon cancer screening due, patient  opted for FOBT - Influenza declined - Tdap UTD - PHQ2 negative - Healthy adult BMI - BP in range - Routine fasting labs pending  Discussed precancerous nature of AK's. Recommended follow-up shave biopsy of both skin lesions for diagnosis and treatment   Patient education and anticipatory guidance given Patient agrees with treatment plan Follow-up in 1 year for CPE  Darlyne Russian PA-C

## 2017-10-21 NOTE — Patient Instructions (Addendum)
Stool for Occult Blood Test Why am I having this test? Stool for occult blood, or fecal occult blood test (FOBT), is a test that is used to screen for gastrointestinal (GI) bleeding, which may be an indicator of colon cancer. This test can also detect small amounts of blood in your stool (feces) from other causes, such as ulcers or hemorrhoids. This test is usually done as part of an annual routine examination after age 55. What kind of sample is taken? A sample of your stool is required for this test. Your health care provider may collect the sample with a swab of the rectum. Or, you may be instructed to collect the sample in a container at home. If you are instructed to collect the sample, your health care provider will provide you with the instructions and the supplies that you will need to do that. How do I collect samples at home? A stool sample may need to be collected at home. When collecting a sample at home, make sure that you:  Use the sterile containers and other supplies that were given to you from the lab.  Do not mix urine, toilet paper, or water with your sample.  Label all slides and containers with your name and the date when you collected the sample.  Your health care provider or lab staff will give you one or more test "cards." You will collect a separate sample from three different stools, usually on different days that follow each other. Follow these steps for each sample: 1. Collect a stool sample into a clean container. 2. With an applicator stick, apply a thin smear of stool sample onto each filter paper square or window that is on the card. 3. Allow the filter paper to dry. After it is dry, the sample will be stable.  Usually, you will return all of the samples to your health care provider or lab at the same time. How do I prepare for this test?  Do not eat any red meat within three days before testing.  Follow your health care provider's instructions about eating  and drinking prior to the test. Your health care provider may instruct you to avoid other foods or substances.  Ask your health care provider about taking or not taking your medicines prior to the test. You may be instructed to avoid certain medicines that are known to interfere with this test. How are the results reported? Your test results will be reported as either positive or negative. It is your responsibility to obtain your test results. Ask the lab or department performing the test when and how you will get your results. What do the results mean? A negative test result means that there is no occult blood within the stool. A negative result is normal. A positive test result may mean that there is blood in the stool. Causes of blood in the stool include:  GI tumors.  Certain GI diseases.  GI trauma or recent surgery.  Hemorrhoids.  Talk with your health care provider to discuss your results, treatment options, and if necessary, the need for more tests. Talk with your health care provider if you have any questions about your results. Talk with your health care provider to discuss your results, treatment options, and if necessary, the need for more tests. Talk with your health care provider if you have any questions about your results. This information is not intended to replace advice given to you by your health care provider. Make sure you discuss  any questions you have with your health care provider. Document Released: 03/20/2004 Document Revised: 10/23/2015 Document Reviewed: 07/21/2013 Elsevier Interactive Patient Education  2018 [place] Years, Female Preventive care refers to lifestyle choices and visits with your health care provider that can promote health and wellness. What does preventive care include?  A yearly physical exam. This is also called an annual well check.  Dental exams once or twice a year.  Routine eye exams. Ask your health  care provider how often you should have your eyes checked.  Personal lifestyle choices, including: ? Daily care of your teeth and gums. ? Regular physical activity. ? Eating a healthy diet. ? Avoiding tobacco and drug use. ? Limiting alcohol use. ? Practicing safe sex. ? Taking low-dose aspirin daily starting at age 44. ? Taking vitamin and mineral supplements as recommended by your health care provider. What happens during an annual well check? The services and screenings done by your health care provider during your annual well check will depend on your age, overall health, lifestyle risk factors, and family history of disease. Counseling Your health care provider may ask you questions about your:  Alcohol use.  Tobacco use.  Drug use.  Emotional well-being.  Home and relationship well-being.  Sexual activity.  Eating habits.  Work and work Statistician.  Method of birth control.  Menstrual cycle.  Pregnancy history.  Screening You may have the following tests or measurements:  Height, weight, and BMI.  Blood pressure.  Lipid and cholesterol levels. These may be checked every 5 years, or more frequently if you are over 43 years old.  Skin check.  Lung cancer screening. You may have this screening every year starting at age 10 if you have a 30-pack-year history of smoking and currently smoke or have quit within the past 15 years.  Fecal occult blood test (FOBT) of the stool. You may have this test every year starting at age 15.  Flexible sigmoidoscopy or colonoscopy. You may have a sigmoidoscopy every 5 years or a colonoscopy every 10 years starting at age 66.  Hepatitis C blood test.  Hepatitis B blood test.  Sexually transmitted disease (STD) testing.  Diabetes screening. This is done by checking your blood sugar (glucose) after you have not eaten for a while (fasting). You may have this done every 1-3 years.  Mammogram. This may be done every 1-2  years. Talk to your health care provider about when you should start having regular mammograms. This may depend on whether you have a family history of breast cancer.  BRCA-related cancer screening. This may be done if you have a family history of breast, ovarian, tubal, or peritoneal cancers.  Pelvic exam and Pap test. This may be done every 3 years starting at age 42. Starting at age 36, this may be done every 5 years if you have a Pap test in combination with an HPV test.  Bone density scan. This is done to screen for osteoporosis. You may have this scan if you are at high risk for osteoporosis.  Discuss your test results, treatment options, and if necessary, the need for more tests with your health care provider. Vaccines Your health care provider may recommend certain vaccines, such as:  Influenza vaccine. This is recommended every year.  Tetanus, diphtheria, and acellular pertussis (Tdap, Td) vaccine. You may need a Td booster every 10 years.  Varicella vaccine. You may need this if you have not been vaccinated.  Zoster  vaccine. You may need this after age 69.  Measles, mumps, and rubella (MMR) vaccine. You may need at least one dose of MMR if you were born in 1957 or later. You may also need a second dose.  Pneumococcal 13-valent conjugate (PCV13) vaccine. You may need this if you have certain conditions and were not previously vaccinated.  Pneumococcal polysaccharide (PPSV23) vaccine. You may need one or two doses if you smoke cigarettes or if you have certain conditions.  Meningococcal vaccine. You may need this if you have certain conditions.  Hepatitis A vaccine. You may need this if you have certain conditions or if you travel or work in places where you may be exposed to hepatitis A.  Hepatitis B vaccine. You may need this if you have certain conditions or if you travel or work in places where you may be exposed to hepatitis B.  Haemophilus influenzae type b (Hib)  vaccine. You may need this if you have certain conditions.  Talk to your health care provider about which screenings and vaccines you need and how often you need them. This information is not intended to replace advice given to you by your health care provider. Make sure you discuss any questions you have with your health care provider. Document Released: 03/22/2015 Document Revised: 11/13/2015 Document Reviewed: 12/25/2014 Elsevier Interactive Patient Education  [employer].

## 2017-10-22 LAB — COMPREHENSIVE METABOLIC PANEL
AG Ratio: 2.1 (calc) (ref 1.0–2.5)
ALKALINE PHOSPHATASE (APISO): 55 U/L (ref 33–130)
ALT: 20 U/L (ref 6–29)
AST: 20 U/L (ref 10–35)
Albumin: 4.6 g/dL (ref 3.6–5.1)
BUN: 10 mg/dL (ref 7–25)
CO2: 25 mmol/L (ref 20–32)
Calcium: 9.3 mg/dL (ref 8.6–10.4)
Chloride: 102 mmol/L (ref 98–110)
Creat: 0.76 mg/dL (ref 0.50–1.05)
Globulin: 2.2 g/dL (calc) (ref 1.9–3.7)
Glucose, Bld: 87 mg/dL (ref 65–139)
Potassium: 4.3 mmol/L (ref 3.5–5.3)
Sodium: 137 mmol/L (ref 135–146)
Total Bilirubin: 0.4 mg/dL (ref 0.2–1.2)
Total Protein: 6.8 g/dL (ref 6.1–8.1)

## 2017-10-22 LAB — CBC
HEMATOCRIT: 38.6 % (ref 35.0–45.0)
Hemoglobin: 12.9 g/dL (ref 11.7–15.5)
MCH: 30.1 pg (ref 27.0–33.0)
MCHC: 33.4 g/dL (ref 32.0–36.0)
MCV: 90 fL (ref 80.0–100.0)
MPV: 11.5 fL (ref 7.5–12.5)
PLATELETS: 221 10*3/uL (ref 140–400)
RBC: 4.29 10*6/uL (ref 3.80–5.10)
RDW: 12.2 % (ref 11.0–15.0)
WBC: 7 10*3/uL (ref 3.8–10.8)

## 2017-10-22 LAB — LIPID PANEL W/REFLEX DIRECT LDL
CHOLESTEROL: 188 mg/dL (ref <200)
HDL: 70 mg/dL (ref >50)
LDL CHOLESTEROL (CALC): 98 mg/dL
Non-HDL Cholesterol (Calc): 118 mg/dL (calc) (ref <130)
TRIGLYCERIDES: 100 mg/dL (ref <150)
Total CHOL/HDL Ratio: 2.7 (calc) (ref <5.0)

## 2017-10-22 LAB — TSH+FREE T4: TSH W/REFLEX TO FT4: 1.84 mIU/L

## 2017-10-22 LAB — SPECIMEN COMPROMISED

## 2017-10-30 ENCOUNTER — Encounter: Payer: Self-pay | Admitting: Physician Assistant

## 2017-10-30 DIAGNOSIS — L989 Disorder of the skin and subcutaneous tissue, unspecified: Secondary | ICD-10-CM | POA: Insufficient documentation

## 2017-11-04 ENCOUNTER — Encounter: Payer: Self-pay | Admitting: Sports Medicine

## 2017-11-04 ENCOUNTER — Ambulatory Visit: Payer: Commercial Managed Care - PPO | Admitting: Sports Medicine

## 2017-11-04 VITALS — BP 127/76 | HR 70 | Ht 61.5 in | Wt 122.0 lb

## 2017-11-04 DIAGNOSIS — L820 Inflamed seborrheic keratosis: Secondary | ICD-10-CM | POA: Diagnosis not present

## 2017-11-04 DIAGNOSIS — L570 Actinic keratosis: Secondary | ICD-10-CM | POA: Diagnosis not present

## 2017-11-04 DIAGNOSIS — L989 Disorder of the skin and subcutaneous tissue, unspecified: Secondary | ICD-10-CM

## 2017-11-04 DIAGNOSIS — M25512 Pain in left shoulder: Secondary | ICD-10-CM | POA: Diagnosis not present

## 2017-11-04 NOTE — Assessment & Plan Note (Signed)
Left-sided impingement symptoms. Adding rotator cuff rehab exercises, return in 1 month for this.

## 2017-11-04 NOTE — Assessment & Plan Note (Signed)
I removed several lesions that appear to be actinic keratoses versus verrucae. Shave biopsy on the right cheek and the mid upper chest. She does have another lesion to the left of the nasal nare, we will see what the pathology shows on the initial ones before removing this as well. I also performed cryotherapy on several skin tags on her abdomen, chest and left axilla.

## 2017-11-04 NOTE — Progress Notes (Signed)
Subjective:    I'm seeing this patient as a consultation for: Nelson Chimes, PA-C  CC: Left shoulder pain, skin lesions  HPI: This is a pleasant 57 year old female, for months she is had pain in her left shoulder, over the deltoid, worse with internal rotation and abduction, difficult to do Pilates.  Moderate, persistent without radiation past the elbow.  She also has several skin lesions, one on her right cheek, upper chest, and several skin tags.  I reviewed the past medical history, family history, social history, surgical history, and allergies today and no changes were needed.  Please see the problem list section below in epic for further details.  Past Medical History: Past Medical History:  Diagnosis Date  . Allergy   . GERD (gastroesophageal reflux disease)   . Infertility, female    Dr Carren Rang G1P0   Past Surgical History: Past Surgical History:  Procedure Laterality Date  . CHOLECYSTECTOMY    . ECTOPIC PREGNANCY SURGERY     laproscopy  . REFRACTIVE SURGERY    . UTERINE FIBROID SURGERY     polyp   Social History: Social History   Socioeconomic History  . Marital status: Married    Spouse name: Not on file  . Number of children: Not on file  . Years of education: Not on file  . Highest education level: Not on file  Occupational History  . Not on file  Social Needs  . Financial resource strain: Not on file  . Food insecurity:    Worry: Not on file    Inability: Not on file  . Transportation needs:    Medical: Not on file    Non-medical: Not on file  Tobacco Use  . Smoking status: Former Smoker    Packs/day: 0.25    Years: 2.00    Pack years: 0.50    Types: Cigarettes    Last attempt to quit: 05/20/1983    Years since quitting: 34.4  . Smokeless tobacco: Never Used  Substance and Sexual Activity  . Alcohol use: Yes    Alcohol/week: 7.0 standard drinks    Types: 7 Glasses of wine per week  . Drug use: Never  . Sexual activity: Yes    Birth  control/protection: Surgical  Lifestyle  . Physical activity:    Days per week: Not on file    Minutes per session: Not on file  . Stress: Not on file  Relationships  . Social connections:    Talks on phone: Not on file    Gets together: Not on file    Attends religious service: Not on file    Active member of club or organization: Not on file    Attends meetings of clubs or organizations: Not on file    Relationship status: Not on file  Other Topics Concern  . Not on file  Social History Narrative   Married non smoker    Adoptive child   Family History: Family History  Problem Relation Age of Onset  . Pulmonary fibrosis Mother        deceased  . Thyroid disease Brother   . Thyroid disease Sister   . Collagen disease Sister   . Heart disease Father   . Coronary artery disease Unknown   . Alcohol abuse Unknown    Allergies: No Known Allergies Medications: See med rec.  Review of Systems: No headache, visual changes, nausea, vomiting, diarrhea, constipation, dizziness, abdominal pain, skin rash, fevers, chills, night sweats, weight loss, swollen lymph nodes,  body aches, joint swelling, muscle aches, chest pain, shortness of breath, mood changes, visual or auditory hallucinations.   Objective:   General: Well Developed, well nourished, and in no acute distress.  Neuro:  Extra-ocular muscles intact, able to move all 4 extremities, sensation grossly intact.  Deep tendon reflexes tested were normal. Psych: Alert and oriented, mood congruent with affect. ENT:  Ears and nose appear unremarkable.  Hearing grossly normal. Neck: Unremarkable overall appearance, trachea midline.  No visible thyroid enlargement. Eyes: Conjunctivae and lids appear unremarkable.  Pupils equal and round. Skin: Warm and dry, no rashes noted.  There are a couple of dome shaped rough skin lesions approximately 1 cm across on her upper chest and right cheek.  She has another one to the left of the left nasal  nare area several skin tags. Cardiovascular: Pulses palpable, no extremity edema. Left shoulder: Inspection reveals no abnormalities, atrophy or asymmetry. Palpation is normal with no tenderness over AC joint or bicipital groove. ROM is full in all planes. Rotator cuff strength normal throughout. Positive Neer and Hawkin's tests, empty can. Speeds and Yergason's tests normal. No labral pathology noted with negative Obrien's, negative crank, negative clunk, and good stability. Normal scapular function observed. No painful arc and no drop arm sign. No apprehension sign  Procedure: Shave biopsy of 1 cm right cheek and upper chest suspicious skin lesion Risks, benefits, and alternatives explained and consent obtained. Time out conducted. Surface prepped with alcohol. 1cc lidocaine with epinephine infiltrated in a field block spread out between the 2 lesions. Adequate anesthesia ensured. Area prepped and draped in a sterile fashion. Excision performed with: Using a DermaBlade I remove the lesion into the dermis, and then used a Hyfrecator to achieve hemostasis. Hemostasis achieved. Pt stable.  Procedure:  Cryodestruction of skin tags on the left axilla, abdomen and mid chest Consent obtained and verified. Time-out conducted. Noted no overlying erythema, induration, or other signs of local infection. Completed without difficulty using Cryo-Gun. Advised to call if fevers/chills, erythema, induration, drainage, or persistent bleeding.  Impression and Recommendations:   This case required medical decision making of moderate complexity.  Left shoulder pain Left-sided impingement symptoms. Adding rotator cuff rehab exercises, return in 1 month for this.  Actinic keratoses I removed several lesions that appear to be actinic keratoses versus verrucae. Shave biopsy on the right cheek and the mid upper chest. She does have another lesion to the left of the nasal nare, we will see what the  pathology shows on the initial ones before removing this as well. I also performed cryotherapy on several skin tags on her abdomen, chest and left axilla. ___________________________________________ Gwen Her. Dianah Field, M.D., ABFM., CAQSM. Primary Care and [place] Instructor of [place] [facility]

## 2017-12-07 ENCOUNTER — Ambulatory Visit (INDEPENDENT_AMBULATORY_CARE_PROVIDER_SITE_OTHER): Payer: Commercial Managed Care - PPO | Admitting: Sports Medicine

## 2017-12-07 DIAGNOSIS — M25512 Pain in left shoulder: Secondary | ICD-10-CM

## 2017-12-07 NOTE — Progress Notes (Signed)
Subjective:    CC: Follow-up  HPI: Skin lesions: Benign SKs and skin tags now healed.  Left shoulder pain: Persistently localized over the deltoid and the triceps, worse with overhead activities.  Was not really that diligent with rehab exercises.  I reviewed the past medical history, family history, social history, surgical history, and allergies today and no changes were needed.  Please see the problem list section below in epic for further details.  Past Medical History: Past Medical History:  Diagnosis Date  . Allergy   . GERD (gastroesophageal reflux disease)   . Infertility, female    Dr Carren Rang G1P0   Past Surgical History: Past Surgical History:  Procedure Laterality Date  . CHOLECYSTECTOMY    . ECTOPIC PREGNANCY SURGERY     laproscopy  . REFRACTIVE SURGERY    . UTERINE FIBROID SURGERY     polyp   Social History: Social History   Socioeconomic History  . Marital status: Married    Spouse name: Not on file  . Number of children: Not on file  . Years of education: Not on file  . Highest education level: Not on file  Occupational History  . Not on file  Social Needs  . Financial resource strain: Not on file  . Food insecurity:    Worry: Not on file    Inability: Not on file  . Transportation needs:    Medical: Not on file    Non-medical: Not on file  Tobacco Use  . Smoking status: Former Smoker    Packs/day: 0.25    Years: 2.00    Pack years: 0.50    Types: Cigarettes    Last attempt to quit: 05/20/1983    Years since quitting: 34.5  . Smokeless tobacco: Never Used  Substance and Sexual Activity  . Alcohol use: Yes    Alcohol/week: 7.0 standard drinks    Types: 7 Glasses of wine per week  . Drug use: Never  . Sexual activity: Yes    Birth control/protection: Surgical  Lifestyle  . Physical activity:    Days per week: Not on file    Minutes per session: Not on file  . Stress: Not on file  Relationships  . Social connections:    Talks on phone:  Not on file    Gets together: Not on file    Attends religious service: Not on file    Active member of club or organization: Not on file    Attends meetings of clubs or organizations: Not on file    Relationship status: Not on file  Other Topics Concern  . Not on file  Social History Narrative   Married non smoker    Adoptive child   Family History: Family History  Problem Relation Age of Onset  . Pulmonary fibrosis Mother        deceased  . Thyroid disease Brother   . Thyroid disease Sister   . Collagen disease Sister   . Heart disease Father   . Coronary artery disease Unknown   . Alcohol abuse Unknown    Allergies: No Known Allergies Medications: See med rec.  Review of Systems: No fevers, chills, night sweats, weight loss, chest pain, or shortness of breath.   Objective:    General: Well Developed, well nourished, and in no acute distress.  Neuro: Alert and oriented x3, extra-ocular muscles intact, sensation grossly intact.  HEENT: Normocephalic, atraumatic, pupils equal round reactive to light, neck supple, no masses, no lymphadenopathy, thyroid nonpalpable.  Skin: Warm and dry, no rashes. Cardiac: Regular rate and rhythm, no murmurs rubs or gallops, no lower extremity edema.  Respiratory: Clear to auscultation bilaterally. Not using accessory muscles, speaking in full sentences. Left shoulder: Inspection reveals no abnormalities, atrophy or asymmetry. Palpation is normal with no tenderness over AC joint or bicipital groove. ROM is full in all planes. Rotator cuff strength normal throughout. Positive Neer and Hawkin's tests, empty can. Speeds and Yergason's tests normal. No labral pathology noted with negative Obrien's, negative crank, negative clunk, and good stability. Normal scapular function observed. No painful arc and no drop arm sign. No apprehension sign  Impression and Recommendations:    Left shoulder pain Never really did the rehab  exercises. Adding a Thera-Band, she will get more diligent with the exercises, avoid overhead activities. Return in 1 month, MRI if no better, she is somewhat averse to doing an injection. ___________________________________________ Gwen Her. Dianah Field, M.D., ABFM., CAQSM. Primary Care and [place] Instructor of [place] [employer] [facility] [employer]

## 2017-12-07 NOTE — Assessment & Plan Note (Signed)
Never really did the rehab exercises. Adding a Thera-Band, she will get more diligent with the exercises, avoid overhead activities. Return in 1 month, MRI if no better, she is somewhat averse to doing an injection.

## 2017-12-07 NOTE — Assessment & Plan Note (Deleted)
Likely uncomplicated cystitis. Adding Keflex, educated on wiping front to back and avoiding after sex. Awaiting urine culture, also adding Pyridium.

## 2017-12-29 ENCOUNTER — Encounter: Payer: Self-pay | Admitting: Sports Medicine

## 2017-12-29 DIAGNOSIS — Z1212 Encounter for screening for malignant neoplasm of rectum: Secondary | ICD-10-CM | POA: Diagnosis not present

## 2017-12-29 DIAGNOSIS — Z1211 Encounter for screening for malignant neoplasm of colon: Secondary | ICD-10-CM | POA: Diagnosis not present

## 2017-12-30 LAB — COLOGUARD: Cologuard: NEGATIVE

## 2018-01-06 ENCOUNTER — Encounter: Payer: Self-pay | Admitting: Sports Medicine

## 2018-01-06 ENCOUNTER — Ambulatory Visit (INDEPENDENT_AMBULATORY_CARE_PROVIDER_SITE_OTHER): Payer: Commercial Managed Care - PPO | Admitting: Sports Medicine

## 2018-01-06 DIAGNOSIS — M25512 Pain in left shoulder: Secondary | ICD-10-CM

## 2018-01-06 NOTE — Assessment & Plan Note (Signed)
Has finally started doing her rehabilitation exercises, she is going to start doing them on both sides. She has just started the exercises and is about 70% better. I gave her some blue and black Thera-Band as well. Return in a month, MRI if insufficiently better, she is averse to doing an injection.

## 2018-01-06 NOTE — Progress Notes (Signed)
Subjective:    CC: Shoulder pain  HPI: [name] May returns, she is a pleasant 57 year old female with rotator cuff impingement syndrome.  She has been getting more consistent with her rehabilitation exercises, and has noted within the last week a 70% improvement in her discomfort.  I reviewed the past medical history, family history, social history, surgical history, and allergies today and no changes were needed.  Please see the problem list section below in epic for further details.  Past Medical History: Past Medical History:  Diagnosis Date  . Allergy   . GERD (gastroesophageal reflux disease)   . Infertility, female    Dr Carren Rang G1P0   Past Surgical History: Past Surgical History:  Procedure Laterality Date  . CHOLECYSTECTOMY    . ECTOPIC PREGNANCY SURGERY     laproscopy  . REFRACTIVE SURGERY    . UTERINE FIBROID SURGERY     polyp   Social History: Social History   Socioeconomic History  . Marital status: Married    Spouse name: Not on file  . Number of children: Not on file  . Years of education: Not on file  . Highest education level: Not on file  Occupational History  . Not on file  Social Needs  . Financial resource strain: Not on file  . Food insecurity:    Worry: Not on file    Inability: Not on file  . Transportation needs:    Medical: Not on file    Non-medical: Not on file  Tobacco Use  . Smoking status: Former Smoker    Packs/day: 0.25    Years: 2.00    Pack years: 0.50    Types: Cigarettes    Last attempt to quit: 05/20/1983    Years since quitting: 34.6  . Smokeless tobacco: Never Used  Substance and Sexual Activity  . Alcohol use: Yes    Alcohol/week: 7.0 standard drinks    Types: 7 Glasses of wine per week  . Drug use: Never  . Sexual activity: Yes    Birth control/protection: Surgical  Lifestyle  . Physical activity:    Days per week: Not on file    Minutes per session: Not on file  . Stress: Not on file  Relationships  . Social  connections:    Talks on phone: Not on file    Gets together: Not on file    Attends religious service: Not on file    Active member of club or organization: Not on file    Attends meetings of clubs or organizations: Not on file    Relationship status: Not on file  Other Topics Concern  . Not on file  Social History Narrative   Married non smoker    Adoptive child   Family History: Family History  Problem Relation Age of Onset  . Pulmonary fibrosis Mother        deceased  . Thyroid disease Brother   . Thyroid disease Sister   . Collagen disease Sister   . Heart disease Father   . Coronary artery disease Unknown   . Alcohol abuse Unknown    Allergies: No Known Allergies Medications: See med rec.  Review of Systems: No fevers, chills, night sweats, weight loss, chest pain, or shortness of breath.   Objective:    General: Well Developed, well nourished, and in no acute distress.  Neuro: Alert and oriented x3, extra-ocular muscles intact, sensation grossly intact.  HEENT: Normocephalic, atraumatic, pupils equal round reactive to light, neck supple, no masses, no  lymphadenopathy, thyroid nonpalpable.  Skin: Warm and dry, no rashes. Cardiac: Regular rate and rhythm, no murmurs rubs or gallops, no lower extremity edema.  Respiratory: Clear to auscultation bilaterally. Not using accessory muscles, speaking in full sentences.  Impression and Recommendations:    Left shoulder pain Has finally started doing her rehabilitation exercises, she is going to start doing them on both sides. She has just started the exercises and is about 70% better. I gave her some blue and black Thera-Band as well. Return in a month, MRI if insufficiently better, she is averse to doing an injection.  I spent 25 minutes with this patient, greater than 50% was face-to-face time counseling regarding the above diagnoses, specifically discussing the biomechanics and rehabilitation techniques for rotator  cuff impingement syndrome. ___________________________________________ Gwen Her. Dianah Field, M.D., ABFM., CAQSM. Primary Care and Sports Medicine [facility]  Adjunct Professor of [place] [employer] [facility] [employer]

## 2018-03-08 IMAGING — DX DG HIP (WITH OR WITHOUT PELVIS) 2-3V*R*
3 series · 3 of 3 positions shown · non-contrast
Comparison: None.

CLINICAL DATA: Hip pain

EXAM:
DG HIP (WITH OR WITHOUT PELVIS) 2-3V RIGHT

[pelvis ap]
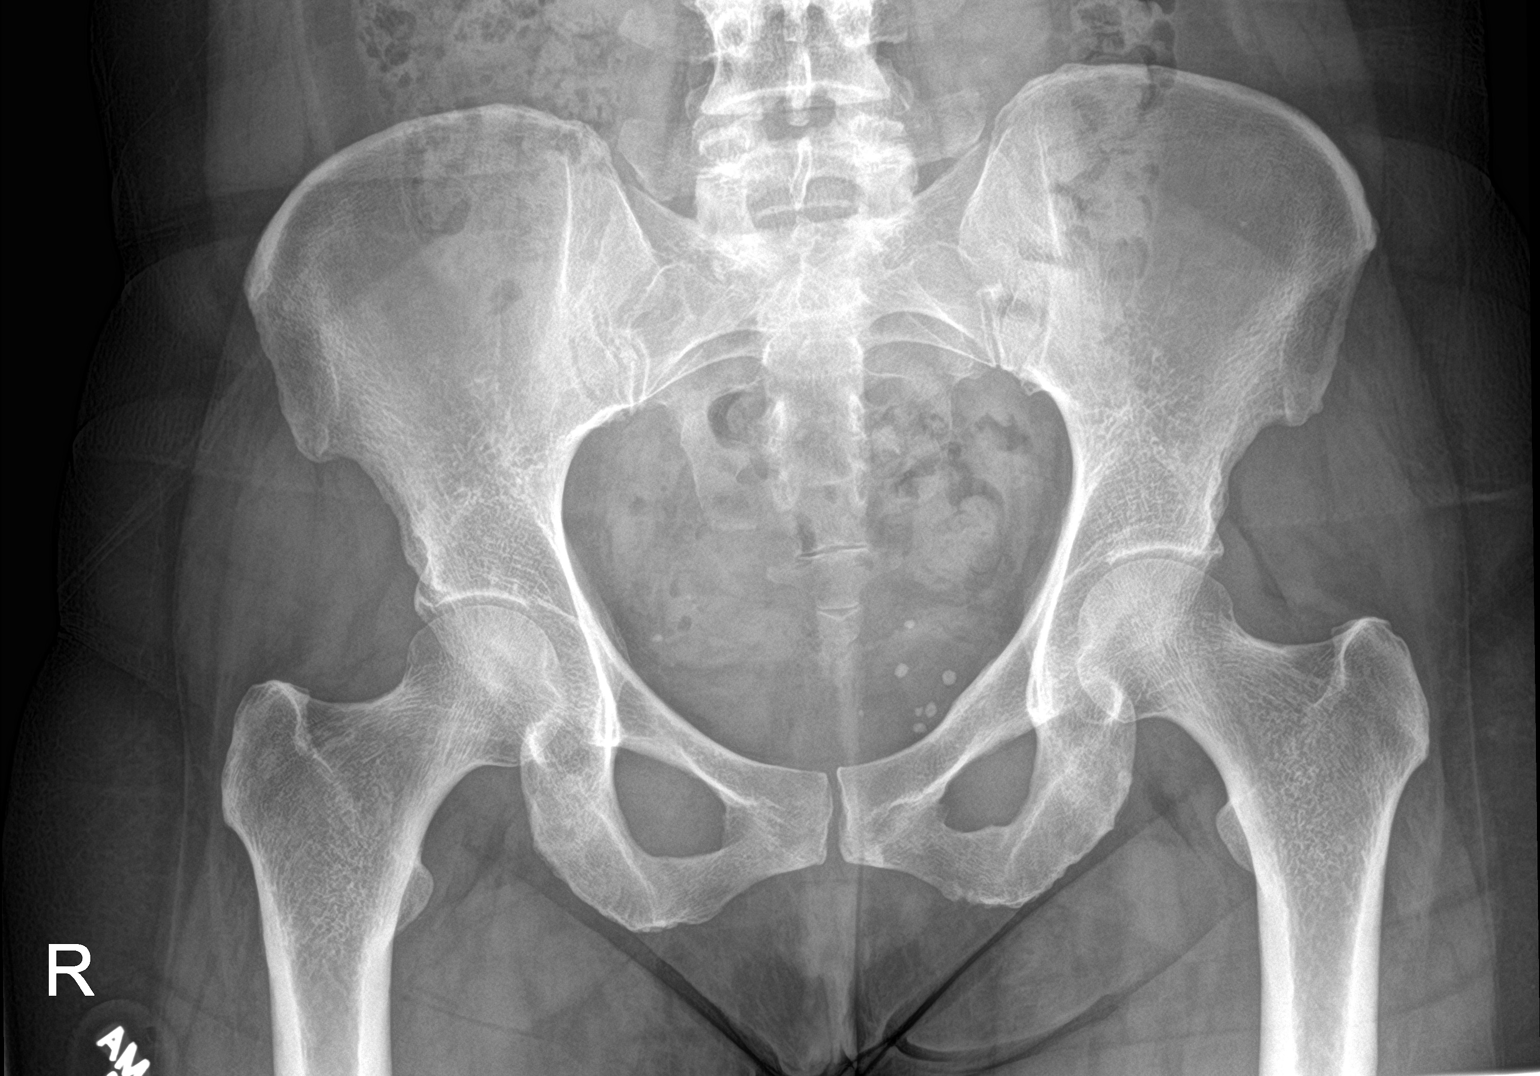

[hip ap]
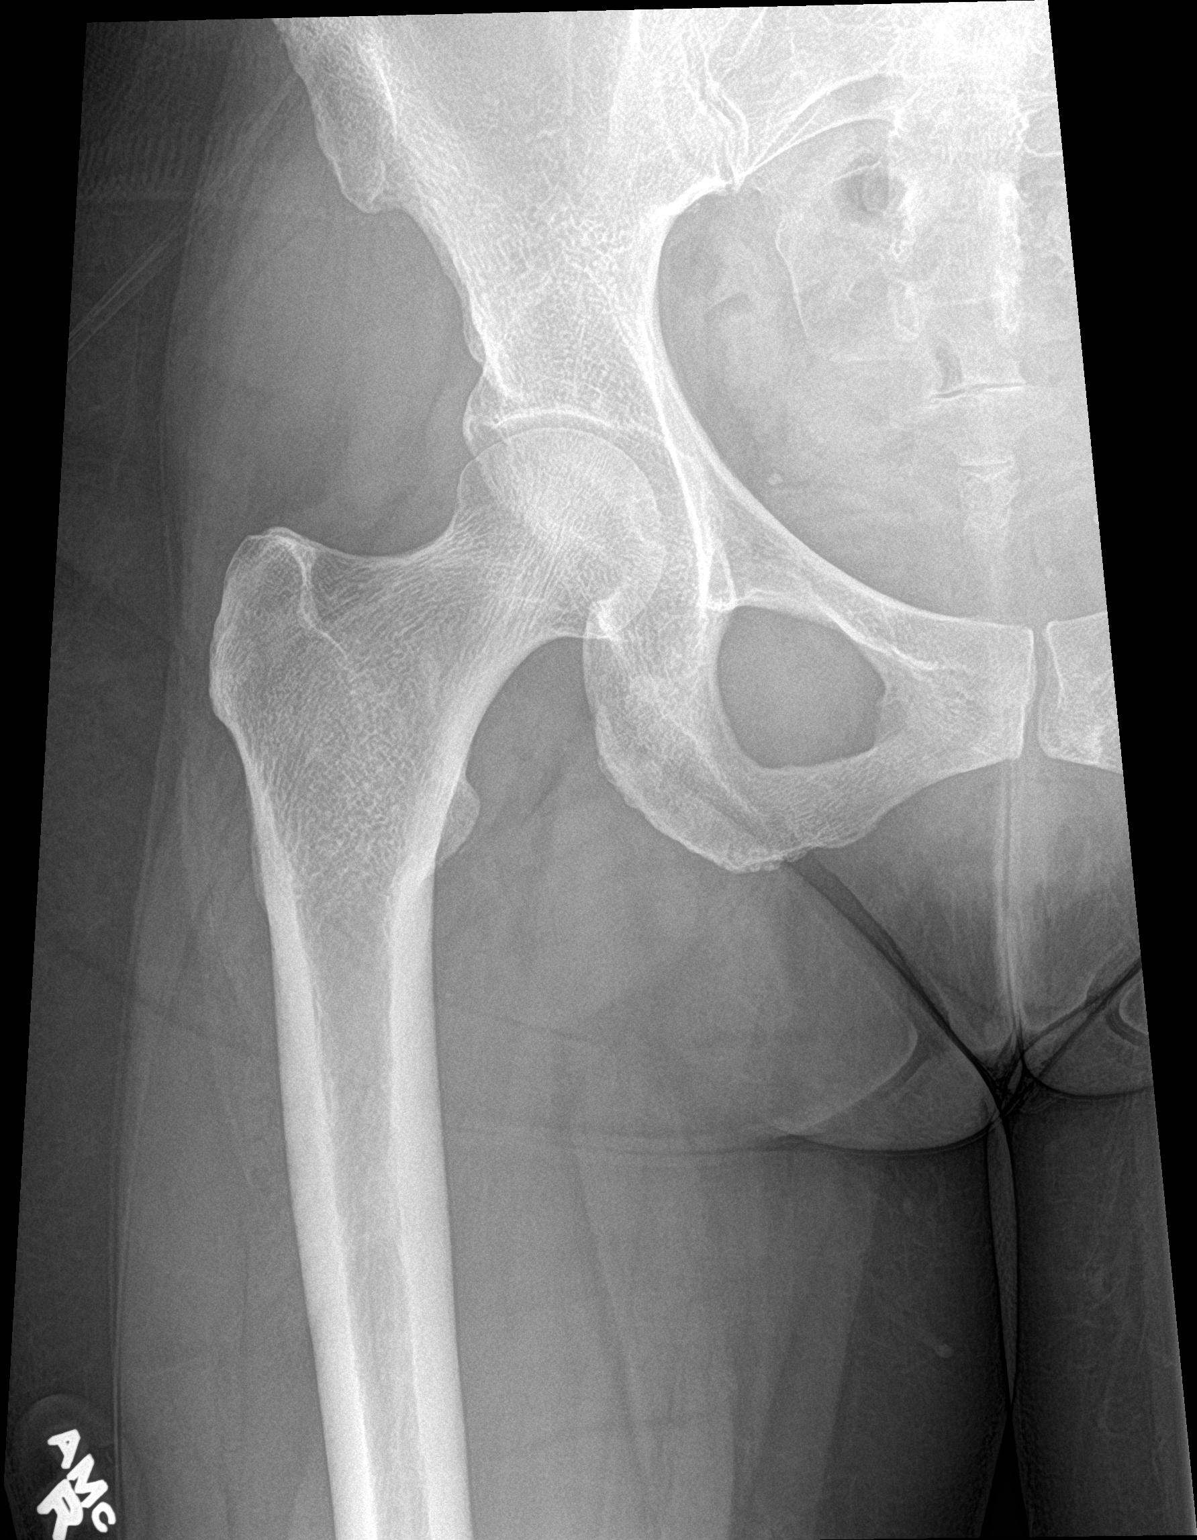

[hip lat]
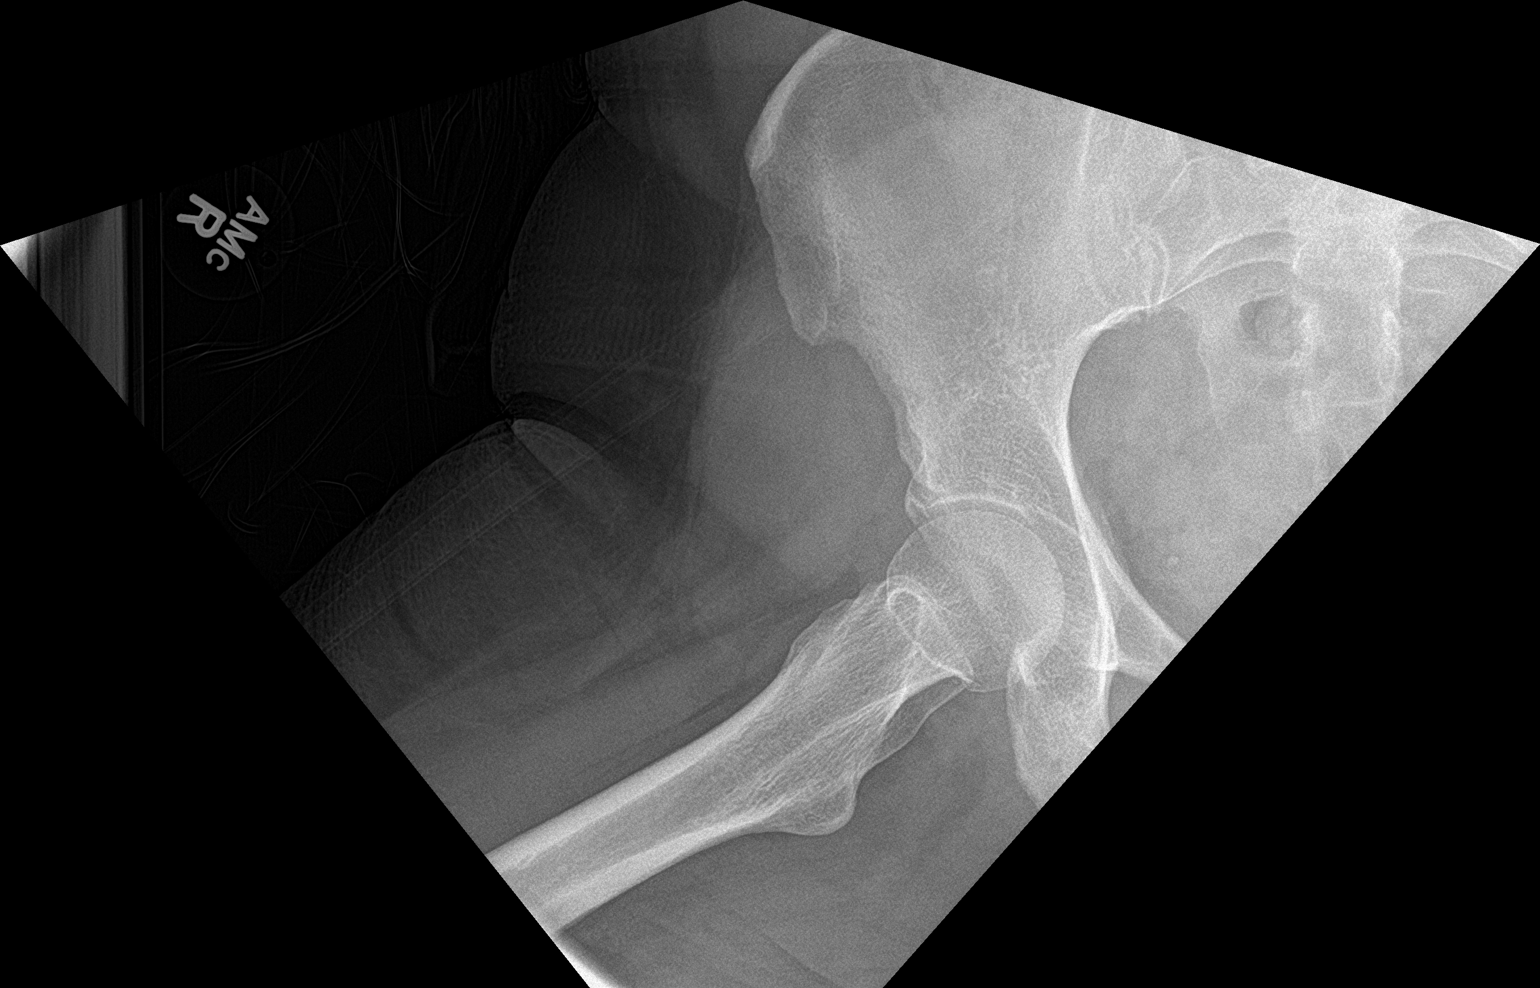

[3 of 3 positions shown; findings below may reference images not displayed]

FINDINGS: SI joints are patent. Multiple calcified pelvic phleboliths. Pubic
symphysis appears intact.

No fracture or malalignment.  Minimal narrowing of the joint space.
IMPRESSION: Minimal joint space narrowing.  No acute osseous abnormality.

## 2018-03-08 IMAGING — DX DG LUMBAR SPINE COMPLETE 4+V
5 series · 5 of 5 positions shown · non-contrast
Comparison: None.

CLINICAL DATA: Right hip pain and low back pain for 1 month

EXAM:
LUMBAR SPINE - COMPLETE 4+ VIEW

[l-spine ap]
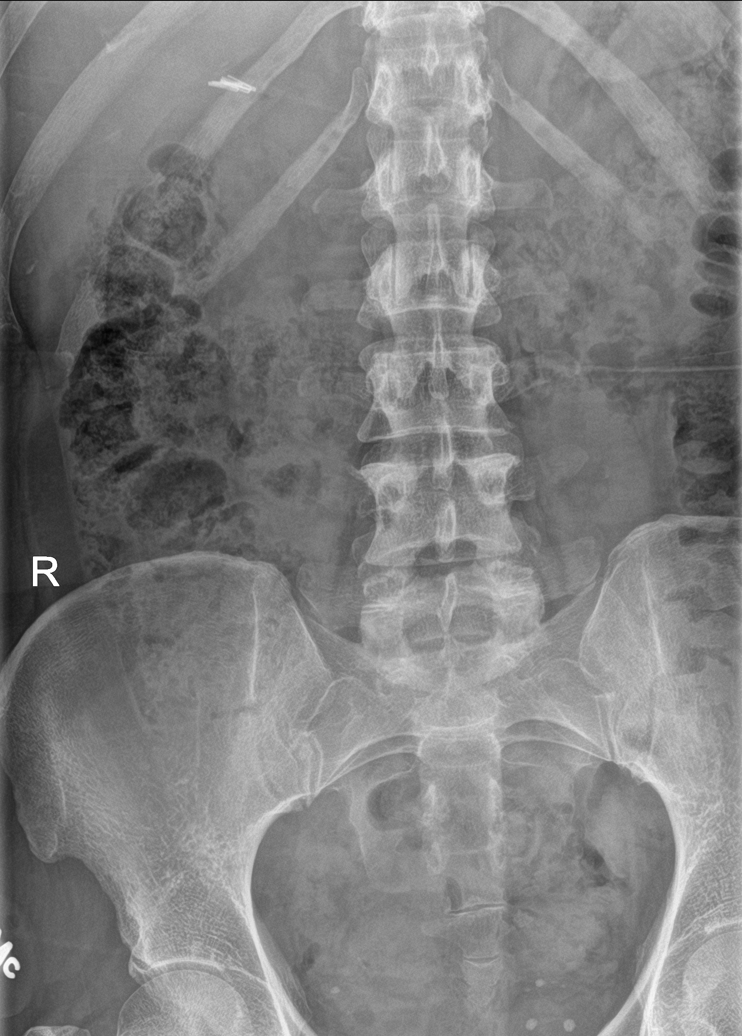

[l-spine obl (1 of 2)]
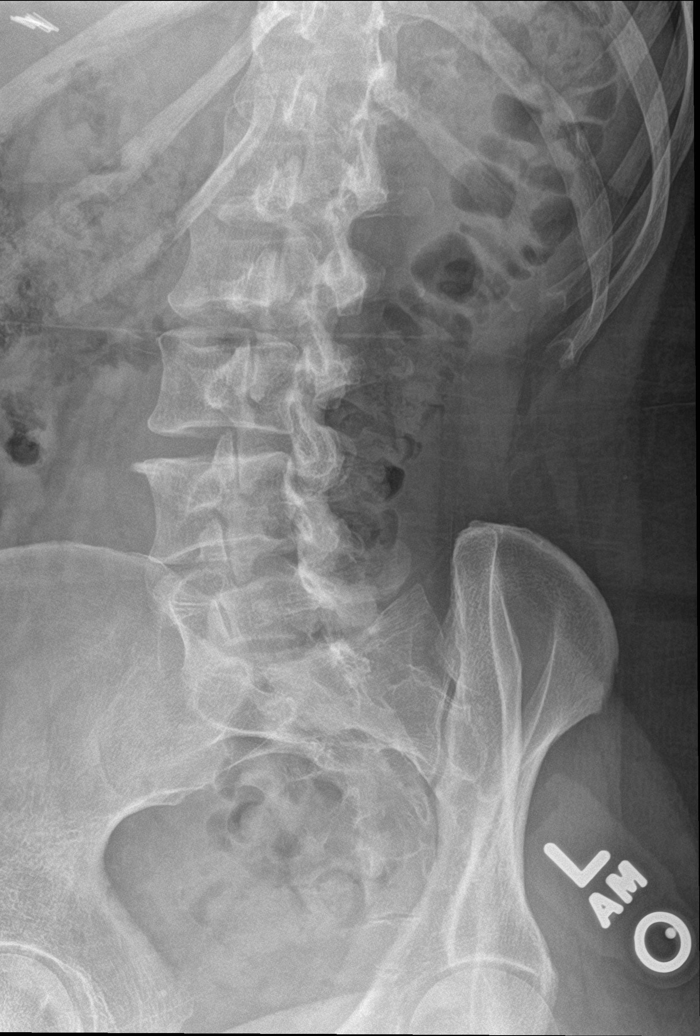

[l-spine obl (2 of 2)]
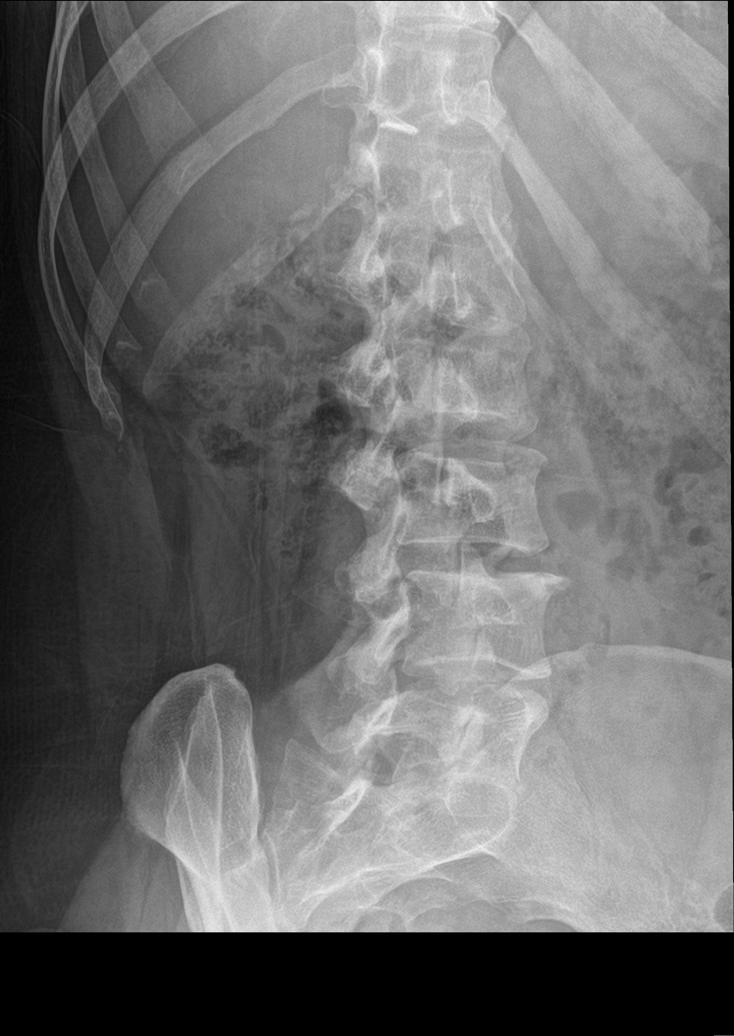

[l-spine lat]
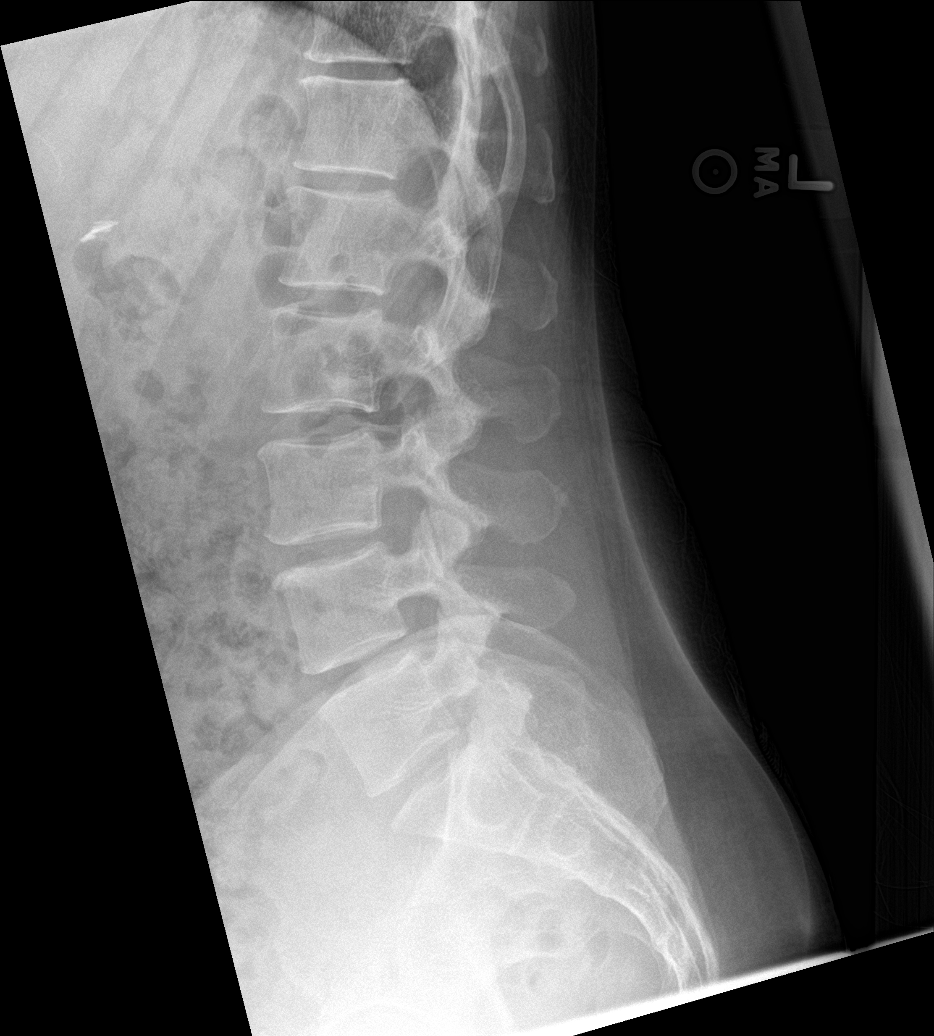

[l-spine spot]
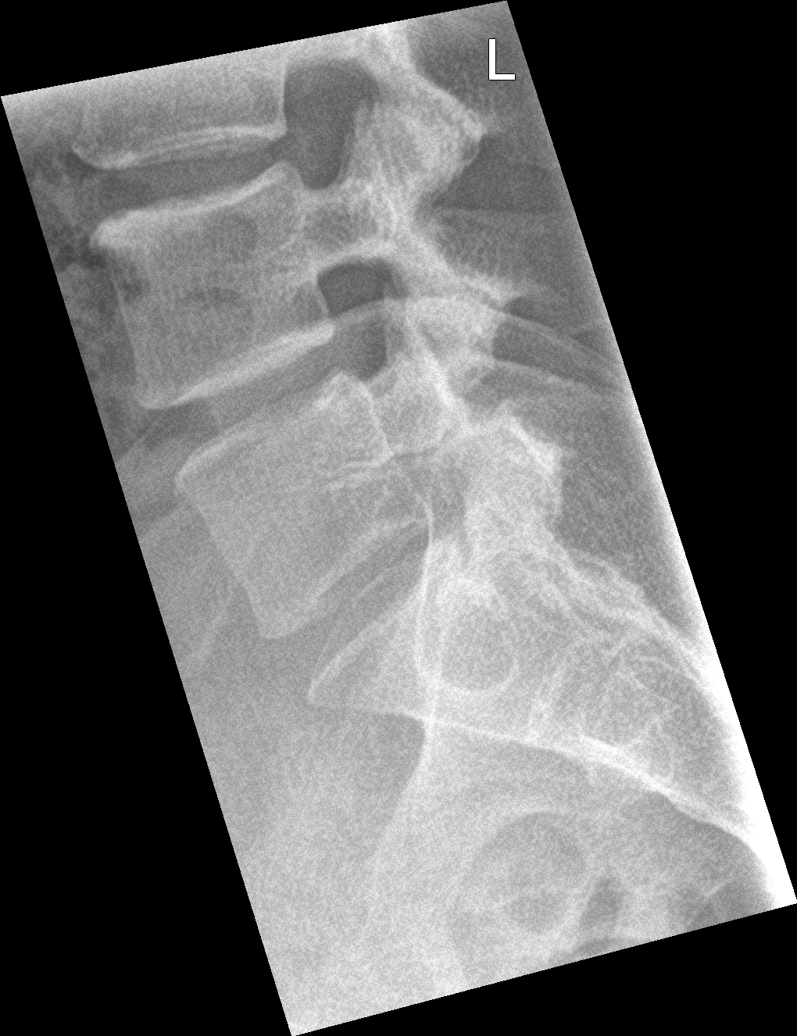

[5 of 5 positions shown; findings below may reference images not displayed]

FINDINGS: Surgical clips in the right upper quadrant. 5 non rib-bearing lumbar
type vertebra. SI joints patent. Calcified pelvic phleboliths.
Lumbar alignment is within normal limits. Mild degenerative disc
changes at L3-L4. Vertebral body heights are normal.
IMPRESSION: Mild degenerative disc changes.  No acute osseous abnormality.

## 2018-10-21 ENCOUNTER — Encounter: Payer: Self-pay | Admitting: Physician Assistant

## 2018-10-21 ENCOUNTER — Other Ambulatory Visit: Payer: Self-pay

## 2018-10-21 ENCOUNTER — Ambulatory Visit (INDEPENDENT_AMBULATORY_CARE_PROVIDER_SITE_OTHER): Payer: Commercial Managed Care - PPO | Admitting: Physician Assistant

## 2018-10-21 VITALS — BP 114/74 | HR 69 | Temp 98.1°F | Wt 122.0 lb

## 2018-10-21 DIAGNOSIS — Z130 Encounter for screening for diseases of the blood and blood-forming organs and certain disorders involving the immune mechanism: Secondary | ICD-10-CM

## 2018-10-21 DIAGNOSIS — Z13220 Encounter for screening for lipoid disorders: Secondary | ICD-10-CM | POA: Diagnosis not present

## 2018-10-21 DIAGNOSIS — Z1231 Encounter for screening mammogram for malignant neoplasm of breast: Secondary | ICD-10-CM | POA: Diagnosis not present

## 2018-10-21 DIAGNOSIS — Z0000 Encounter for general adult medical examination without abnormal findings: Secondary | ICD-10-CM | POA: Diagnosis not present

## 2018-10-21 DIAGNOSIS — Z131 Encounter for screening for diabetes mellitus: Secondary | ICD-10-CM

## 2018-10-21 NOTE — Patient Instructions (Signed)

## 2018-10-21 NOTE — Progress Notes (Signed)
HPI:                                                                [name] May is a 58 y.o. female who presents to [place] today for annual physical with Pap smear only  Current Concerns include  Reports daughter [name] May recently came out as transgender, using he/him pronouns.  Reports loss of dog 2 weeks ago.  She is coping by spending time with friends, kayaking and gardening.   Health Maintenance Health Maintenance  Topic Date Due   MAMMOGRAM  01/02/2018   INFLUENZA VACCINE  10/08/2018   HIV Screening  05/20/2026 (Originally 01/02/1976)   PAP SMEAR-Modifier  01/02/2021   TETANUS/TDAP  01/02/2025   COLONOSCOPY  12/30/2027   Hepatitis C Screening  Completed    Past Medical History:  Diagnosis Date   Allergy    GERD (gastroesophageal reflux disease)    Infertility, female    Dr Carren Rang G1P0   Past Surgical History:  Procedure Laterality Date   CHOLECYSTECTOMY     ECTOPIC PREGNANCY SURGERY     laproscopy   REFRACTIVE SURGERY     UTERINE FIBROID SURGERY     polyp   Social History   Tobacco Use   Smoking status: Former Smoker    Packs/day: 0.25    Years: 2.00    Pack years: 0.50    Types: Cigarettes    Quit date: 05/20/1983    Years since quitting: 35.4   Smokeless tobacco: Never Used  Substance Use Topics   Alcohol use: Yes    Alcohol/week: 7.0 standard drinks    Types: 7 Glasses of wine per week   family history includes Alcohol abuse in her unknown relative; Collagen disease in her sister; Coronary artery disease in her unknown relative; Heart disease in her father; Pulmonary fibrosis in her mother; Thyroid disease in her brother and sister.  ROS: negative except as noted in the HPI  Medications: Current Outpatient Medications  Medication Sig Dispense Refill   diphenhydrAMINE (BENADRYL) 25 mg capsule Take 25 mg by mouth at bedtime as needed.     Melatonin 1 MG TABS Take 1 mg  by mouth at bedtime.     Multiple Vitamin (MULTIVITAMIN) capsule Take 1 capsule by mouth daily.     No current facility-administered medications for this visit.    No Known Allergies     Objective:  BP 114/74    Pulse 69    Temp 98.1 F (36.7 C) (Oral)    Wt 122 lb (55.3 kg)    BMI 22.68 kg/m   Wt Readings from Last 3 Encounters:  10/21/18 122 lb (55.3 kg)  11/04/17 122 lb (55.3 kg)  10/21/17 123 lb (55.8 kg)   Temp Readings from Last 3 Encounters:  10/25/18 98.7 F (37.1 C) (Oral)  10/21/18 98.1 F (36.7 C) (Oral)  05/02/10 98.7 F (37.1 C) (Oral)   BP Readings from Last 3 Encounters:  10/25/18 112/78  10/21/18 114/74  01/06/18 120/76   Pulse Readings from Last 3 Encounters:  10/25/18 77  10/21/18 69  01/06/18 75    General Appearance:  Alert, cooperative, no distress, appropriate for age  Head:  Normocephalic, without obvious abnormality                             Eyes:  PERRL, EOM's intact, conjunctiva and cornea clear                             Ears:  TM pearly gray color and semitransparent, external ear canals normal, both ears                            Nose:  Nares symmetrical, mucosa pink                          Throat:  Lips, tongue, and mucosa are moist, pink, and intact; oropharynx clear, uvula midline; good dentition                             Neck:  Supple; symmetrical, trachea midline, no adenopathy; thyroid: no enlargement, symmetric, no tenderness/mass/nodules                             Back:  Symmetrical, no curvature, ROM normal               Chest/Breast:  deferred                           Lungs:  Clear to auscultation bilaterally, respirations unlabored                             Heart:  normal rate & regular rhythm, S1 and S2 normal, no murmurs, rubs, or gallops                     Abdomen:  Soft, non-tender, no mass or organomegaly              Genitourinary:  deferred         Musculoskeletal:  Tone and  strength strong and symmetrical, all extremities; no joint pain or edema, normal gait and station                                       Lymphatic:  No adenopathy             Skin/Hair/Nails:  Skin warm, dry and intact, right upper back there is an irregularly shaped hypopigmented plaque with fine scale; left anterior shin there is a small hyperpigmented papule                        Neurologic:  Alert and oriented x3, no cranial nerve deficits, DTR's intact, sensation grossly intact, normal gait and station, no tremor Psych: well-groomed, cooperative, good eye contact, euthymic mood, affect mood-congruent, speech is articulate, and thought processes clear and goal-directed    No results found for this or any previous visit (from the past 72 hour(s)). No results found.    Assessment and Plan: 58 y.o. female with   .[name] was seen today for annual exam.  Diagnoses and all orders for this visit:  Breast cancer  screening by mammogram -     MM 3D SCREEN BREAST BILATERAL; Future  Encounter for annual physical exam -     COMPLETE METABOLIC PANEL WITH GFR -     Lipid Panel w/reflex Direct LDL -     CBC  Screening for lipid disorders -     Lipid Panel w/reflex Direct LDL  Screening for blood disease -     COMPLETE METABOLIC PANEL WITH GFR -     CBC  Screening for diabetes mellitus -     Hemoglobin A1c   - Personally reviewed PMH, PSH, PFH, medications, allergies, HM - Age-appropriate cancer screening: Cologuard UTD, Pap UTD per patient (await record from [facility]), overdue for mammogram - Influenza declined - Tdap UTD - PHQ2 negative - Fall screen negative - Routine fasting labs pending  Recommended patient return for shave biopsy for suspicious skin lesion of upper back  Patient education and anticipatory guidance given Patient agrees with treatment plan Follow-up in 1 week for shave biopsy or sooner as needed  Darlyne Russian PA-C

## 2018-10-22 LAB — COMPLETE METABOLIC PANEL WITH GFR
AG Ratio: 1.6 (calc) (ref 1.0–2.5)
ALT: 19 U/L (ref 6–29)
AST: 17 U/L (ref 10–35)
Albumin: 4.5 g/dL (ref 3.6–5.1)
Alkaline phosphatase (APISO): 48 U/L (ref 37–153)
BUN: 13 mg/dL (ref 7–25)
CO2: 30 mmol/L (ref 20–32)
Calcium: 9.5 mg/dL (ref 8.6–10.4)
Chloride: 102 mmol/L (ref 98–110)
Creat: 0.74 mg/dL (ref 0.50–1.05)
GFR, Est African American: 106 mL/min/{1.73_m2} (ref >=60)
GFR, Est Non African American: 91 mL/min/{1.73_m2} (ref >=60)
Globulin: 2.9 g/dL (calc) (ref 1.9–3.7)
Glucose, Bld: 89 mg/dL (ref 65–99)
Potassium: 4.1 mmol/L (ref 3.5–5.3)
Sodium: 138 mmol/L (ref 135–146)
Total Bilirubin: 0.4 mg/dL (ref 0.2–1.2)
Total Protein: 7.4 g/dL (ref 6.1–8.1)

## 2018-10-22 LAB — LIPID PANEL W/REFLEX DIRECT LDL
Cholesterol: 233 mg/dL — ABNORMAL HIGH (ref <200)
HDL: 84 mg/dL (ref >=50)
LDL Cholesterol (Calc): 132 mg/dL (calc) — ABNORMAL HIGH
Non-HDL Cholesterol (Calc): 149 mg/dL (calc) — ABNORMAL HIGH (ref <130)
Total CHOL/HDL Ratio: 2.8 (calc) (ref <5.0)
Triglycerides: 74 mg/dL (ref <150)

## 2018-10-22 LAB — CBC
HCT: 40.5 % (ref 35.0–45.0)
Hemoglobin: 13.4 g/dL (ref 11.7–15.5)
MCH: 29.6 pg (ref 27.0–33.0)
MCHC: 33.1 g/dL (ref 32.0–36.0)
MCV: 89.6 fL (ref 80.0–100.0)
MPV: 11.7 fL (ref 7.5–12.5)
Platelets: 215 10*3/uL (ref 140–400)
RBC: 4.52 10*6/uL (ref 3.80–5.10)
RDW: 12 % (ref 11.0–15.0)
WBC: 5.8 10*3/uL (ref 3.8–10.8)

## 2018-10-22 LAB — HEMOGLOBIN A1C
Hgb A1c MFr Bld: 5.3 % of total Hgb (ref <5.7)
Mean Plasma Glucose: 105 (calc)
eAG (mmol/L): 5.8 (calc)

## 2018-10-25 ENCOUNTER — Encounter: Payer: Self-pay | Admitting: Physician Assistant

## 2018-10-25 ENCOUNTER — Other Ambulatory Visit: Payer: Self-pay

## 2018-10-25 ENCOUNTER — Ambulatory Visit (INDEPENDENT_AMBULATORY_CARE_PROVIDER_SITE_OTHER): Payer: Commercial Managed Care - PPO | Admitting: Physician Assistant

## 2018-10-25 VITALS — BP 112/78 | HR 77 | Temp 98.7°F

## 2018-10-25 DIAGNOSIS — L989 Disorder of the skin and subcutaneous tissue, unspecified: Secondary | ICD-10-CM

## 2018-10-25 NOTE — Patient Instructions (Signed)
Skin Biopsy, Care After This sheet gives you information about how to care for yourself after your procedure. Your health care provider may also give you more specific instructions. If you have problems or questions, contact your health care provider. What can I expect after the procedure? After the procedure, it is common to have:  Soreness.  Bruising.  Itching. Follow these instructions at home: Biopsy site care Follow instructions from your health care provider about how to take care of your biopsy site. Make sure you:  Wash your hands with soap and water before and after you change your bandage (dressing). If soap and water are not available, use hand sanitizer.  Apply ointment on your biopsy site as directed by your health care provider.  Change your dressing as told by your health care provider.  Leave stitches (sutures), skin glue, or adhesive strips in place. These skin closures may need to stay in place for 2 weeks or longer. If adhesive strip edges start to loosen and curl up, you may trim the loose edges. Do not remove adhesive strips completely unless your health care provider tells you to do that.  If the biopsy area bleeds, apply gentle pressure for 10 minutes. Check your biopsy site every day for signs of infection. Check for:  Redness, swelling, or pain.  Fluid or blood.  Warmth.  Pus or a bad smell.  General instructions  Rest and then return to your normal activities as told by your health care provider.  Take over-the-counter and prescription medicines only as told by your health care provider.  Keep all follow-up visits as told by your health care provider. This is important. Contact a health care provider if:  You have redness, swelling, or pain around your biopsy site.  You have fluid or blood coming from your biopsy site.  Your biopsy site feels warm to the touch.  You have pus or a bad smell coming from your biopsy site.  You have a fever.   Your sutures, skin glue, or adhesive strips loosen or come off sooner than expected. Get help right away if:  You have bleeding that does not stop with pressure or a dressing. Summary  After the procedure, it is common to have soreness, bruising, and itching at the site.  Follow instructions from your health care provider about how to take care of your biopsy site.  Check your biopsy site every day for signs of infection.  Contact a health care provider if you have redness, swelling, or pain around your biopsy site, or your biopsy site feels warm to the touch.  Keep all follow-up visits as told by your health care provider. This is important. This information is not intended to replace advice given to you by your health care provider. Make sure you discuss any questions you have with your health care provider. Document Released: 03/22/2015 Document Revised: 08/23/2017 Document Reviewed: 08/23/2017 Elsevier Patient Education  2020 Elsevier Inc.  

## 2018-10-25 NOTE — Progress Notes (Signed)
Shave Biopsy Procedure Note  Pre-operative Diagnosis: Suspicious lesion     Post-operative Diagnosis: same  Locations: right upper back/trapezial area  Indications: diagnosis, r/o skin cancer  Anesthesia: cold spray, patient declined lidocaine  Procedure Details  History of allergy to iodine: no  Patient informed of the risks (including bleeding and infection) and benefits of the  procedure and Verbal informed consent obtained.  The lesion and surrounding area were given a sterile prep using chlorhexidine and draped in the usual sterile fashion. A dermablade was used to shave an area of skin approximately 1 cm x 1 cm. Hemostasis achieved with heat cautery. Antibiotic ointment and a sterile dressing applied.  The specimen was sent for pathologic examination. The patient tolerated the procedure well.    Condition: Stable  Complications: none.  Plan: 1. Instructed to keep the wound dry and covered for 24-48h and clean thereafter. Use emollient like Aquafor or Vaseline prn itching/dryness 2. Warning signs of infection were reviewed.   3. Recommended that the patient use OTC analgesics as needed for pain.   Patient education and anticipatory guidance given Patient agrees with treatment plan Follow-up pending biopsy result or sooner as needed  Darlyne Russian PA-C

## 2018-11-07 ENCOUNTER — Encounter: Payer: Self-pay | Admitting: Physician Assistant

## 2018-11-09 ENCOUNTER — Encounter: Payer: Self-pay | Admitting: Physician Assistant

## 2018-11-17 ENCOUNTER — Ambulatory Visit (INDEPENDENT_AMBULATORY_CARE_PROVIDER_SITE_OTHER): Payer: Commercial Managed Care - PPO | Admitting: Physician Assistant

## 2018-11-17 ENCOUNTER — Other Ambulatory Visit (HOSPITAL_COMMUNITY)
Admission: RE | Admit: 2018-11-17 | Discharge: 2018-11-17 | Disposition: A | Discharge status: Home / Self Care | Payer: Commercial Managed Care - PPO | Source: Ambulatory Visit | Attending: Physician Assistant | Admitting: Physician Assistant

## 2018-11-17 ENCOUNTER — Ambulatory Visit (INDEPENDENT_AMBULATORY_CARE_PROVIDER_SITE_OTHER): Payer: Commercial Managed Care - PPO

## 2018-11-17 ENCOUNTER — Other Ambulatory Visit: Payer: Self-pay

## 2018-11-17 ENCOUNTER — Encounter: Payer: Self-pay | Admitting: Physician Assistant

## 2018-11-17 VITALS — BP 124/84 | HR 67 | Temp 98.3°F | Wt 121.0 lb

## 2018-11-17 DIAGNOSIS — Z8049 Family history of malignant neoplasm of other genital organs: Secondary | ICD-10-CM

## 2018-11-17 DIAGNOSIS — Z1231 Encounter for screening mammogram for malignant neoplasm of breast: Secondary | ICD-10-CM | POA: Diagnosis not present

## 2018-11-17 DIAGNOSIS — Z124 Encounter for screening for malignant neoplasm of cervix: Secondary | ICD-10-CM | POA: Diagnosis present

## 2018-11-17 NOTE — Progress Notes (Signed)
HPI:                                                                [name] May is a 58 y.o. female who presents to [facility] today for Pap smear only  Current Concerns include none    GYN/Sexual Health  Obstetrics: [id number]  Menstrual status: perimenopausal  LMP: 1 week ago  Menses: irregular  Last pap smear: 2017, normal per patient  History of abnormal pap smears: no  Sexually active: yes, 1 female partner (husband)  Current contraception: none, hx of infertility  History of STI: never  Health Maintenance Health Maintenance  Topic Date Due  . MAMMOGRAM  01/02/2018  . INFLUENZA VACCINE  06/07/2019 (Originally 10/08/2018)  . HIV Screening  05/20/2026 (Originally 01/02/1976)  . Fecal DNA (Cologuard)  12/30/2020  . PAP SMEAR-Modifier  01/02/2021  . TETANUS/TDAP  01/02/2025  . Hepatitis C Screening  Completed    Past Medical History:  Diagnosis Date  . Allergy   . GERD (gastroesophageal reflux disease)   . Infertility, female    Dr Carren Rang G1P0   Past Surgical History:  Procedure Laterality Date  . CHOLECYSTECTOMY    . ECTOPIC PREGNANCY SURGERY     laproscopy  . REFRACTIVE SURGERY    . UTERINE FIBROID SURGERY     polyp   Social History   Tobacco Use  . Smoking status: Former Smoker    Packs/day: 0.25    Years: 2.00    Pack years: 0.50    Types: Cigarettes    Quit date: 05/20/1983    Years since quitting: 35.5  . Smokeless tobacco: Never Used  Substance Use Topics  . Alcohol use: Yes    Alcohol/week: 7.0 standard drinks    Types: 7 Glasses of wine per week   family history includes Alcohol abuse in an other family member; Cervical cancer in her paternal grandmother; Collagen disease in her sister; Coronary artery disease in an other family member; Heart disease in her father; Pulmonary fibrosis in her mother; Thyroid disease in her brother and sister.  ROS: negative except as noted in the  HPI  Medications: Current Outpatient Medications  Medication Sig Dispense Refill  . cetirizine (ZYRTEC) 10 MG tablet Take 10 mg by mouth daily.    . diphenhydrAMINE (BENADRYL) 25 mg capsule Take 25 mg by mouth at bedtime as needed.    . Melatonin 1 MG TABS Take 1 mg by mouth at bedtime.    . mometasone (NASONEX) 50 MCG/ACT nasal spray Place 2 sprays into the nose daily.    . Multiple Vitamin (MULTIVITAMIN) capsule Take 1 capsule by mouth daily.     No current facility-administered medications for this visit.    No Known Allergies     Objective:  BP 124/84   Pulse 67   Temp 98.3 F (36.8 C) (Oral)   Wt 121 lb (54.9 kg)   BMI 22.49 kg/m  GU: vulva without rashes or lesions, normal introitus and urethral meatus, vaginal mucosa without erythema, normal discharge, cervix non-friable without lesions,   A chaperone was present for the GU portion of the exam, [name], RMA.    No results found for this or any previous visit (from the past  72 hour(s)). No results found.    Assessment and Plan: 58 y.o. female with   .Mosley was seen today for gynecologic exam.  Diagnoses and all orders for this visit:  Encounter for Pap smear of cervix with HPV DNA cotesting -     Cytology - PAP  Family history of cervical cancer Comments: PGM   Fam hx of cervical cancer in one 2nd degree relative. No fam hx of ovarian/breast Mammogram completed today. Results pending  Patient education and anticipatory guidance given Patient agrees with treatment plan Follow-up based on Pap results or sooner as needed  Darlyne Russian PA-C

## 2018-11-21 ENCOUNTER — Encounter: Payer: Self-pay | Admitting: Physician Assistant

## 2018-11-21 DIAGNOSIS — N952 Postmenopausal atrophic vaginitis: Secondary | ICD-10-CM | POA: Insufficient documentation

## 2018-11-21 LAB — CYTOLOGY - PAP
Diagnosis: NEGATIVE
HPV: NOT DETECTED

## 2018-12-06 ENCOUNTER — Encounter: Payer: Self-pay | Admitting: Physician Assistant

## 2018-12-06 NOTE — Progress Notes (Signed)
Negative for intraepithelial lesion or malignancy.  

## 2019-01-06 ENCOUNTER — Other Ambulatory Visit: Payer: Self-pay

## 2019-01-06 DIAGNOSIS — Z20822 Contact with and (suspected) exposure to covid-19: Secondary | ICD-10-CM

## 2019-01-07 LAB — NOVEL CORONAVIRUS, NAA: SARS-CoV-2, NAA: NOT DETECTED

## 2019-02-16 ENCOUNTER — Other Ambulatory Visit: Payer: Self-pay

## 2019-02-16 DIAGNOSIS — Z20822 Contact with and (suspected) exposure to covid-19: Secondary | ICD-10-CM

## 2019-02-18 LAB — NOVEL CORONAVIRUS, NAA: SARS-CoV-2, NAA: NOT DETECTED

## 2019-03-14 ENCOUNTER — Ambulatory Visit: Payer: Commercial Managed Care - PPO | Attending: Internal Medicine

## 2019-03-14 DIAGNOSIS — Z20822 Contact with and (suspected) exposure to covid-19: Secondary | ICD-10-CM

## 2019-03-16 LAB — NOVEL CORONAVIRUS, NAA: SARS-CoV-2, NAA: NOT DETECTED

## 2019-09-05 ENCOUNTER — Ambulatory Visit: Payer: Commercial Managed Care - PPO | Admitting: Medical-Surgical

## 2019-09-06 ENCOUNTER — Ambulatory Visit: Payer: Commercial Managed Care - PPO | Admitting: Medical-Surgical

## 2019-10-23 ENCOUNTER — Encounter: Payer: Self-pay | Admitting: Medical-Surgical

## 2019-10-23 ENCOUNTER — Ambulatory Visit (INDEPENDENT_AMBULATORY_CARE_PROVIDER_SITE_OTHER): Payer: Commercial Managed Care - PPO | Admitting: Medical-Surgical

## 2019-10-23 VITALS — BP 138/79 | HR 71 | Temp 97.9°F | Ht 61.5 in | Wt 124.2 lb

## 2019-10-23 DIAGNOSIS — Z23 Encounter for immunization: Secondary | ICD-10-CM

## 2019-10-23 DIAGNOSIS — Z0000 Encounter for general adult medical examination without abnormal findings: Secondary | ICD-10-CM | POA: Diagnosis not present

## 2019-10-23 NOTE — Patient Instructions (Signed)

## 2019-10-23 NOTE — Progress Notes (Signed)
HPI: [name] May is a 59 y.o. female who  has a past medical history of Allergy, GERD (gastroesophageal reflux disease), and Infertility, female.  she presents to [facility] today, 10/23/19,  for chief complaint of:  Annual physical exam  Dentist: every 6 months, no concerns Eye: several years, reading glasses, lasik many years ago Exercise: Pilates 2-3 times a week, limited by hip/back pain; stationary bike Diet: eats all food groups, no special diet; tries to avoid junk/processed foods Mammogram: last year, normal Pap: 11/2018, normal Colorectal screening: Cologuard 12/2017  Hip pain: has trouble rolling over in bed without hip pain, especially in the right hip; has had posterior left thigh pain off and on for months; axial low back pain with history of DDD  Past medical, surgical, social and family history reviewed:  Patient Active Problem List   Diagnosis Date Noted  . Atrophic vaginitis 11/21/2018  . Left shoulder pain 11/04/2017  . Skin lesion of chest wall 10/30/2017  . Actinic keratoses 10/21/2017  . Perimenopausal 10/21/2017  . Vasomotor flushing 10/21/2017  . Mixed hyperlipidemia 05/20/2016  . Right hip pain 05/19/2016  . Infertility, female   . Colon cancer screening 05/02/2010  . RHINITIS, CHRONIC 01/02/2009  . GERD 10/22/2008    Past Surgical History:  Procedure Laterality Date  . CHOLECYSTECTOMY    . ECTOPIC PREGNANCY SURGERY     laproscopy  . REFRACTIVE SURGERY    . UTERINE FIBROID SURGERY     polyp    Social History   Tobacco Use  . Smoking status: Former Smoker    Packs/day: 0.25    Years: 2.00    Pack years: 0.50    Types: Cigarettes    Quit date: 05/20/1983    Years since quitting: 36.4  . Smokeless tobacco: Never Used  Substance Use Topics  . Alcohol use: Yes    Comment: 4 drinks/week, wine or beer    Family History  Problem Relation Age of Onset  . Pulmonary fibrosis Mother        deceased   . Thyroid disease Brother   . Thyroid disease Sister   . Collagen disease Sister   . Heart disease Father   . Coronary artery disease Other   . Alcohol abuse Other   . Cervical cancer Paternal Grandmother      Current medication list and allergy/intolerance information reviewed:    Current Outpatient Medications  Medication Sig Dispense Refill  . cetirizine (ZYRTEC) 10 MG tablet Take 10 mg by mouth daily.    . diphenhydrAMINE (BENADRYL) 25 mg capsule Take 25 mg by mouth at bedtime as needed.    . Melatonin 1 MG TABS Take 1 mg by mouth at bedtime.    . mometasone (NASONEX) 50 MCG/ACT nasal spray Place 2 sprays into the nose daily.    . Multiple Vitamin (MULTIVITAMIN) capsule Take 1 capsule by mouth daily.     No current facility-administered medications for this visit.    No Known Allergies    Review of Systems:  Constitutional:  No  fever, no chills, No recent illness, No unintentional weight changes. No significant fatigue.   HEENT: No  headache, no vision change, no hearing change, No sore throat, No  sinus pressure  Cardiac: No  chest pain, No  pressure, No palpitations, No  Orthopnea  Respiratory:  No  shortness of breath. + occasional Cough  Gastrointestinal: No  abdominal pain, No  nausea, No  vomiting,  No  blood  in stool, No  diarrhea, No  constipation   Musculoskeletal: No new myalgia/arthralgia  Skin: No  Rash, No other wounds/concerning lesions  Genitourinary: No  incontinence, No  abnormal genital bleeding, No abnormal genital discharge  Hem/Onc: No  easy bruising/bleeding, No  abnormal lymph node  Endocrine: No cold intolerance,  + hot flashes. No polyuria/polydipsia/polyphagia   Neurologic: No  weakness, No  dizziness, No  slurred speech/focal weakness/facial droop  Psychiatric: No  concerns with depression, No  concerns with anxiety, + sleep problems, No mood problems  Exam:  BP 138/79   Pulse 71   Temp 97.9 F (36.6 C) (Oral)   Ht 5' 1.5"  (1.562 m)   Wt 124 lb 3.2 oz (56.3 kg)   LMP 10/05/2018 (Approximate)   SpO2 98%   BMI 23.09 kg/m   Constitutional: VS see above. General Appearance: alert, well-developed, well-nourished, NAD  Eyes: Normal lids and conjunctive, non-icteric sclera  Ears, Nose, Mouth, Throat: MMM, Normal external inspection ears/nares/mouth/lips/gums. TM normal bilaterally. Pharynx/tonsils no erythema, no exudate. Nasal mucosa normal.   Neck: No masses, trachea midline. No thyroid enlargement. No tenderness/mass appreciated. No lymphadenopathy  Respiratory: Normal respiratory effort. no wheeze, no rhonchi, no rales  Cardiovascular: S1/S2 normal, no murmur, no rub/gallop auscultated. RRR. No lower extremity edema. Pedal pulse II/IV bilaterally DP and PT. No carotid bruit or JVD. No abdominal aortic bruit.  Gastrointestinal: Nontender, no masses. No hepatomegaly, no splenomegaly. No hernia appreciated. Bowel sounds normal. Rectal exam deferred.   Musculoskeletal: Gait normal. No clubbing/cyanosis of digits.   Neurological: Normal balance/coordination. No tremor. No cranial nerve deficit on limited exam. Motor and sensation intact and symmetric. Cerebellar reflexes intact.   Skin: warm, dry, intact. No rash/ulcer. No concerning nevi or subq nodules on limited exam.    Psychiatric: Normal judgment/insight. Normal mood and affect. Oriented x3.    No results found for this or any previous visit (from the past 72 hour(s)).  No results found.   ASSESSMENT/PLAN:   1. Encounter for annual physical exam Checking CBC, CMP, and lipid panel today.  Up-to-date on mammogram, would like to do this every 2 years instead of every 1 year.  Last Pap smear completed 11/2018, normal.  Cologuard completed 12/2017.  Tdap status unclear but patient would like to defer today.  Due for shingles vaccine. - CBC - COMPLETE METABOLIC PANEL WITH GFR - Lipid panel  2. Hip/back pain Patient interested in finding the cause of  her discomfort and would prefer to avoid medication at this time. Taking prn Ibuprofen. Recommend following up with Dr. Darene Lamer for further evaluation. Patient verbalized understanding and is agreeable to the plan.  2. Need for shingles vaccine Shingrix given in office today.  Patient educated that second shot should be done in 2-6 months. - Varicella-zoster vaccine IM  Orders Placed This Encounter  Procedures  . Varicella-zoster vaccine IM  . CBC  . COMPLETE METABOLIC PANEL WITH GFR  . Lipid panel    No orders of the defined types were placed in this encounter.   Patient Instructions  Preventive Care 10-61 Years Old, Female Preventive care refers to visits with your health care provider and lifestyle choices that can promote health and wellness. This includes:  A yearly physical exam. This may also be called an annual well check.  Regular dental visits and eye exams.  Immunizations.  Screening for certain conditions.  Healthy lifestyle choices, such as eating a healthy diet, getting regular exercise, not using drugs or products that  contain nicotine and tobacco, and limiting alcohol use. What can I expect for my preventive care visit? Physical exam Your health care provider will check your:  Height and weight. This may be used to calculate body mass index (BMI), which tells if you are at a healthy weight.  Heart rate and blood pressure.  Skin for abnormal spots. Counseling Your health care provider may ask you questions about your:  Alcohol, tobacco, and drug use.  Emotional well-being.  Home and relationship well-being.  Sexual activity.  Eating habits.  Work and work Statistician.  Method of birth control.  Menstrual cycle.  Pregnancy history. What immunizations do I need?  Influenza (flu) vaccine  This is recommended every year. Tetanus, diphtheria, and pertussis (Tdap) vaccine  You may need a Td booster every 10 years. Varicella (chickenpox)  vaccine  You may need this if you have not been vaccinated. Zoster (shingles) vaccine  You may need this after age 70. Measles, mumps, and rubella (MMR) vaccine  You may need at least one dose of MMR if you were born in 1957 or later. You may also need a second dose. Pneumococcal conjugate (PCV13) vaccine  You may need this if you have certain conditions and were not previously vaccinated. Pneumococcal polysaccharide (PPSV23) vaccine  You may need one or two doses if you smoke cigarettes or if you have certain conditions. Meningococcal conjugate (MenACWY) vaccine  You may need this if you have certain conditions. Hepatitis A vaccine  You may need this if you have certain conditions or if you travel or work in places where you may be exposed to hepatitis A. Hepatitis B vaccine  You may need this if you have certain conditions or if you travel or work in places where you may be exposed to hepatitis B. Haemophilus influenzae type b (Hib) vaccine  You may need this if you have certain conditions. Human papillomavirus (HPV) vaccine  If recommended by your health care provider, you may need three doses over 6 months. You may receive vaccines as individual doses or as more than one vaccine together in one shot (combination vaccines). Talk with your health care provider about the risks and benefits of combination vaccines. What tests do I need? Blood tests  Lipid and cholesterol levels. These may be checked every 5 years, or more frequently if you are over 57 years old.  Hepatitis C test.  Hepatitis B test. Screening  Lung cancer screening. You may have this screening every year starting at age 68 if you have a 30-pack-year history of smoking and currently smoke or have quit within the past 15 years.  Colorectal cancer screening. All adults should have this screening starting at age 70 and continuing until age 36. Your health care provider may recommend screening at age 66 if you  are at increased risk. You will have tests every 1-10 years, depending on your results and the type of screening test.  Diabetes screening. This is done by checking your blood sugar (glucose) after you have not eaten for a while (fasting). You may have this done every 1-3 years.  Mammogram. This may be done every 1-2 years. Talk with your health care provider about when you should start having regular mammograms. This may depend on whether you have a family history of breast cancer.  BRCA-related cancer screening. This may be done if you have a family history of breast, ovarian, tubal, or peritoneal cancers.  Pelvic exam and Pap test. This may be done every 3  years starting at age 105. Starting at age 46, this may be done every 5 years if you have a Pap test in combination with an HPV test. Other tests  Sexually transmitted disease (STD) testing.  Bone density scan. This is done to screen for osteoporosis. You may have this scan if you are at high risk for osteoporosis. Follow these instructions at home: Eating and drinking  Eat a diet that includes fresh fruits and vegetables, whole grains, lean protein, and low-fat dairy.  Take vitamin and mineral supplements as recommended by your health care provider.  Do not drink alcohol if: ? Your health care provider tells you not to drink. ? You are pregnant, may be pregnant, or are planning to become pregnant.  If you drink alcohol: ? Limit how much you have to 0-1 drink a day. ? Be aware of how much alcohol is in your drink. In the [place] one drink equals one 12 oz bottle of beer (355 mL), one 5 oz glass of wine (148 mL), or one 1 oz glass of hard liquor (44 mL). Lifestyle  Take daily care of your teeth and gums.  Stay active. Exercise for at least 30 minutes on 5 or more days each week.  Do not use any products that contain nicotine or tobacco, such as cigarettes, e-cigarettes, and chewing tobacco. If you need help quitting, ask your  health care provider.  If you are sexually active, practice safe sex. Use a condom or other form of birth control (contraception) in order to prevent pregnancy and STIs (sexually transmitted infections).  If told by your health care provider, take low-dose aspirin daily starting at age 27. What's next?  Visit your health care provider once a year for a well check visit.  Ask your health care provider how often you should have your eyes and teeth checked.  Stay up to date on all vaccines. This information is not intended to replace advice given to you by your health care provider. Make sure you discuss any questions you have with your health care provider. Document Revised: 11/04/2017 Document Reviewed: 11/04/2017 Elsevier Patient Education  [place].  Follow-up plan: Return for evaluation of hip/leg/back pain with Dr. Darene Lamer.; 2nd shingles vaccine in 2-6 months.  Clearnce Sorrel, DNP, APRN, FNP-BC [facility]

## 2019-11-08 ENCOUNTER — Ambulatory Visit (INDEPENDENT_AMBULATORY_CARE_PROVIDER_SITE_OTHER): Payer: Commercial Managed Care - PPO

## 2019-11-08 ENCOUNTER — Other Ambulatory Visit: Payer: Self-pay

## 2019-11-08 ENCOUNTER — Encounter: Payer: Self-pay | Admitting: Sports Medicine

## 2019-11-08 ENCOUNTER — Ambulatory Visit (INDEPENDENT_AMBULATORY_CARE_PROVIDER_SITE_OTHER): Payer: Commercial Managed Care - PPO | Admitting: Sports Medicine

## 2019-11-08 DIAGNOSIS — M25552 Pain in left hip: Secondary | ICD-10-CM

## 2019-11-08 MED ORDER — MELOXICAM 15 MG PO TABS: ORAL_TABLET | ORAL | 3 refills | Status: DC

## 2019-11-08 NOTE — Progress Notes (Signed)
    Procedures performed today:    None.  Independent interpretation of notes and tests performed by another provider:   None.  Brief History, Exam, Impression, and Recommendations:    Left hip pain [name] May has had a vague left-sided hip pain, she localizes it from the buttock down the back of the thigh, but also sometimes in the groin. Worse when rolling over at night, sitting for long periods of time. She does have some pain with internal rotation of the hip, nothing overtly radicular down to the legs or toes. We will start conservative, x-rays of lumbar spine and hip, formal physical therapy working on the hip girdle and lumbar spine, meloxicam, return to see me in 4 to 6 weeks, MRI if no better, my hope is that the pain generator declares itself along the way. [name] May did decline prednisone.    ___________________________________________ Gwen Her. Dianah Field, M.D., ABFM., CAQSM. Primary Care and [place] Instructor of [place] [employer] [facility] [employer]

## 2019-11-08 NOTE — Assessment & Plan Note (Signed)
[name] May has had a vague left-sided hip pain, she localizes it from the buttock down the back of the thigh, but also sometimes in the groin. Worse when rolling over at night, sitting for long periods of time. She does have some pain with internal rotation of the hip, nothing overtly radicular down to the legs or toes. We will start conservative, x-rays of lumbar spine and hip, formal physical therapy working on the hip girdle and lumbar spine, meloxicam, return to see me in 4 to 6 weeks, MRI if no better, my hope is that the pain generator declares itself along the way. [name] May did decline prednisone.

## 2019-12-06 ENCOUNTER — Ambulatory Visit: Payer: Commercial Managed Care - PPO | Admitting: Medical-Surgical

## 2020-11-28 ENCOUNTER — Other Ambulatory Visit: Payer: Self-pay

## 2020-11-28 ENCOUNTER — Ambulatory Visit (INDEPENDENT_AMBULATORY_CARE_PROVIDER_SITE_OTHER): Payer: BC Managed Care – PPO | Admitting: Medical-Surgical

## 2020-11-28 ENCOUNTER — Encounter: Payer: Self-pay | Admitting: Medical-Surgical

## 2020-11-28 VITALS — BP 128/83 | HR 58 | Resp 20 | Ht 61.5 in | Wt 122.8 lb

## 2020-11-28 DIAGNOSIS — Z23 Encounter for immunization: Secondary | ICD-10-CM

## 2020-11-28 DIAGNOSIS — Z1231 Encounter for screening mammogram for malignant neoplasm of breast: Secondary | ICD-10-CM

## 2020-11-28 DIAGNOSIS — Z1329 Encounter for screening for other suspected endocrine disorder: Secondary | ICD-10-CM | POA: Diagnosis not present

## 2020-11-28 DIAGNOSIS — Z0000 Encounter for general adult medical examination without abnormal findings: Secondary | ICD-10-CM | POA: Diagnosis not present

## 2020-11-28 NOTE — Progress Notes (Signed)
HPI: [name] May is a 60 y.o. female who  has a past medical history of Allergy, GERD (gastroesophageal reflux disease), and Infertility, female.  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  She has no family history and is not concerned at this time.  She would like to push this out another year. Colon cancer screening: Cologuard completed COVID vaccine: Up-to-date  Concerns: Skin concern Past medical, surgical, social and family history reviewed:  Patient Active Problem List   Diagnosis Date Noted   Atrophic vaginitis 11/21/2018   Left shoulder pain 11/04/2017   Skin lesion of chest wall 10/30/2017   Actinic keratoses 10/21/2017   Perimenopausal 10/21/2017   Vasomotor flushing 10/21/2017   Mixed hyperlipidemia 05/20/2016   Left hip pain 05/19/2016   Infertility, female    Colon cancer screening 05/02/2010   RHINITIS, CHRONIC 01/02/2009   GERD 10/22/2008    Past Surgical History:  Procedure Laterality Date   CHOLECYSTECTOMY     ECTOPIC PREGNANCY SURGERY     laproscopy   REFRACTIVE SURGERY     UTERINE FIBROID SURGERY     polyp    Social History   Tobacco Use   Smoking status: Former    Packs/day: 0.25    Years: 2.00    Pack years: 0.50    Types: Cigarettes    Quit date: 05/20/1983    Years since quitting: 37.5   Smokeless tobacco: Never  Substance Use Topics   Alcohol use: Yes    Comment: 4 drinks/week, wine or beer    Family History  Problem Relation Age of Onset   Pulmonary fibrosis Mother        deceased   Thyroid disease Brother    Thyroid disease Sister     Collagen disease Sister    Heart disease Father    Coronary artery disease Other    Alcohol abuse Other    Cervical cancer Paternal Grandmother      Current medication list and allergy/intolerance information reviewed:    Current Outpatient Medications  Medication Sig Dispense Refill   cetirizine (ZYRTEC) 10 MG tablet Take 10 mg by mouth daily.     diphenhydrAMINE (BENADRYL) 25 mg capsule Take 25 mg by mouth at bedtime as needed.     Melatonin 1 MG TABS Take 1 mg by mouth at bedtime.     mometasone (NASONEX) 50 MCG/ACT nasal spray Place 2 sprays into the nose daily.     Multiple Vitamin (MULTIVITAMIN) capsule Take 1 capsule by mouth daily.     meloxicam (MOBIC) 15 MG tablet One tab PO qAM with a meal for 2 weeks, then daily prn pain. (Patient not taking: Reported on 11/28/2020) 30 tablet 3   No current facility-administered medications for this visit.    No Known Allergies    Review of Systems: Constitutional:  No  fever, no chills, No recent illness, No unintentional weight changes. No significant fatigue.  HEENT: No  headache, no vision change, no hearing change, No sore throat, No  sinus pressure Cardiac: No  chest pain, No  pressure, No palpitations, No  Orthopnea Respiratory:  No  shortness of breath. No  Cough Gastrointestinal: No  abdominal pain, No  nausea, No  vomiting,  No  blood in stool, No  diarrhea, No  constipation  Musculoskeletal: No new myalgia/arthralgia Skin: No  Rash, area to the right lateral knee that intermittently feels like it stinging when she wears pants or capris but no break in the skin or lesion visible Genitourinary: No  incontinence, No  abnormal genital bleeding, No abnormal genital discharge Hem/Onc: No  easy bruising/bleeding, No  abnormal lymph node Endocrine: No cold intolerance,  No heat intolerance. No polyuria/polydipsia/polyphagia  Neurologic: No  weakness, No  dizziness, No  slurred speech/focal weakness/facial droop Psychiatric: No   concerns with depression, No  concerns with anxiety, No sleep problems, No mood problems  Exam:  BP 128/83 (BP Location: Left Arm, Patient Position: Sitting, Cuff Size: Normal)   Pulse (!) 58   Resp 20   Ht 5' 1.5" (1.562 m)   Wt 122 lb 12.8 oz (55.7 kg)   SpO2 99%   BMI 22.83 kg/m  Constitutional: VS see above. General Appearance: alert, well-developed, well-nourished, NAD Eyes: Normal lids and conjunctive, non-icteric sclera Ears, Nose, Mouth, Throat: MMM, Normal external inspection ears/nares/mouth/lips/gums. TM normal bilaterally.  Neck: No masses, trachea midline. No thyroid enlargement. No tenderness/mass appreciated. No lymphadenopathy Respiratory: Normal respiratory effort. no wheeze, no rhonchi, no rales Cardiovascular: S1/S2 normal, no murmur, no rub/gallop auscultated. RRR. No lower extremity edema. Pedal pulse II/IV bilaterally PT. No carotid bruit or JVD. No abdominal aortic bruit. Gastrointestinal: Nontender, no masses. No hepatomegaly, no splenomegaly. No hernia appreciated. Bowel sounds normal. Rectal exam deferred.  Musculoskeletal: Gait normal. No clubbing/cyanosis of digits.  Neurological: Normal balance/coordination. No tremor. No cranial nerve deficit on limited exam. Motor and sensation intact and symmetric. Cerebellar reflexes intact.  Skin: warm, dry, intact. No rash/ulcer. No concerning nevi or subq nodules on limited exam.  No skin abnormality visible or palpable in the area of concern on her right lateral knee. Psychiatric: Normal judgment/insight. Normal mood and affect. Oriented x3.    ASSESSMENT/PLAN:   1. Annual physical exam Checking CBC with differential, CMP, lipid panel today.  Wellness information provided with AVS. - CBC with Differential/Platelet - Lipid panel - COMPLETE METABOLIC PANEL WITH GFR  2. Thyroid disorder screen Checking TSH. - TSH  3. Encounter for screening mammogram for malignant neoplasm of breast She is overdue for mammogram  and is aware of the risks versus benefits of not following through with recommended screening.  Strongly encouraged her to go ahead and get this done but she still prefers to wait.  4. Need for shingles vaccine Shingrix vaccine #2 given in office today. - Varicella-zoster vaccine IM   Orders Placed This Encounter  Procedures   Varicella-zoster vaccine IM   CBC with Differential/Platelet   Lipid panel   TSH   COMPLETE METABOLIC PANEL WITH GFR    No orders of the defined types were placed in this encounter.   Patient Instructions  Preventive Care 93-27 Years Old, Female Preventive care refers to lifestyle choices and visits with your health care provider that can promote health and wellness. This includes: A yearly physical exam. This is also called an annual wellness visit. Regular dental and eye exams. Immunizations. Screening for certain conditions. Healthy lifestyle choices, such as: Eating a healthy diet. Getting regular exercise. Not using drugs or products that contain nicotine and tobacco. Limiting alcohol use. What can I expect for my preventive care visit? Physical exam Your health care provider will check your: Height  and weight. These may be used to calculate your BMI (body mass index). BMI is a measurement that tells if you are at a healthy weight. Heart rate and blood pressure. Body temperature. Skin for abnormal spots. Counseling Your health care provider may ask you questions about your: Past medical problems. Family's medical history. Alcohol, tobacco, and drug use. Emotional well-being. Home life and relationship well-being. Sexual activity. Diet, exercise, and sleep habits. Work and work Statistician. Access to firearms. Method of birth control. Menstrual cycle. Pregnancy history. What immunizations do I need? Vaccines are usually given at various ages, according to a schedule. Your health care provider will recommend vaccines for you based on your  age, medical history, and lifestyle or other factors, such as travel or where you work. What tests do I need? Blood tests Lipid and cholesterol levels. These may be checked every 5 years, or more often if you are over 47 years old. Hepatitis C test. Hepatitis B test. Screening Lung cancer screening. You may have this screening every year starting at age 69 if you have a 30-pack-year history of smoking and currently smoke or have quit within the past 15 years. Colorectal cancer screening. All adults should have this screening starting at age 74 and continuing until age 3. Your health care provider may recommend screening at age 50 if you are at increased risk. You will have tests every 1-10 years, depending on your results and the type of screening test. Diabetes screening. This is done by checking your blood sugar (glucose) after you have not eaten for a while (fasting). You may have this done every 1-3 years. Mammogram. This may be done every 1-2 years. Talk with your health care provider about when you should start having regular mammograms. This may depend on whether you have a family history of breast cancer. BRCA-related cancer screening. This may be done if you have a family history of breast, ovarian, tubal, or peritoneal cancers. Pelvic exam and Pap test. This may be done every 3 years starting at age 30. Starting at age 8, this may be done every 5 years if you have a Pap test in combination with an HPV test. Other tests STD (sexually transmitted disease) testing, if you are at risk. Bone density scan. This is done to screen for osteoporosis. You may have this scan if you are at high risk for osteoporosis. Talk with your health care provider about your test results, treatment options, and if necessary, the need for more tests. Follow these instructions at home: Eating and drinking  Eat a diet that includes fresh fruits and vegetables, whole grains, lean protein, and low-fat  dairy products. Take vitamin and mineral supplements as recommended by your health care provider. Do not drink alcohol if: Your health care provider tells you not to drink. You are pregnant, may be pregnant, or are planning to become pregnant. If you drink alcohol: Limit how much you have to 0-1 drink a day. Be aware of how much alcohol is in your drink. In the [place] one drink equals one 12 oz bottle of beer (355 mL), one 5 oz glass of wine (148 mL), or one 1 oz glass of hard liquor (44 mL). Lifestyle Take daily care of your teeth and gums. Brush your teeth every morning and night with fluoride toothpaste. Floss one time each day. Stay active. Exercise for at least 30 minutes 5 or more days each week. Do not use any products that contain nicotine or tobacco, such as cigarettes,  e-cigarettes, and chewing tobacco. If you need help quitting, ask your health care provider. Do not use drugs. If you are sexually active, practice safe sex. Use a condom or other form of protection to prevent STIs (sexually transmitted infections). If you do not wish to become pregnant, use a form of birth control. If you plan to become pregnant, see your health care provider for a prepregnancy visit. If told by your health care provider, take low-dose aspirin daily starting at age 34. Find healthy ways to cope with stress, such as: Meditation, yoga, or listening to music. Journaling. Talking to a trusted person. Spending time with friends and family. Safety Always wear your seat belt while driving or riding in a vehicle. Do not drive: If you have been drinking alcohol. Do not ride with someone who has been drinking. When you are tired or distracted. While texting. Wear a helmet and other protective equipment during sports activities. If you have firearms in your house, make sure you follow all gun safety procedures. What's next? Visit your health care provider once a year for an annual wellness visit. Ask  your health care provider how often you should have your eyes and teeth checked. Stay up to date on all vaccines. This information is not intended to replace advice given to you by your health care provider. Make sure you discuss any questions you have with your health care provider. Document Revised: 05/03/2020 Document Reviewed: 11/04/2017 Elsevier Patient Education  2022 [employer].  Follow-up plan: Return in about 1 year (around 11/28/2021) for annual physical exam.  Clearnce Sorrel, DNP, APRN, FNP-BC [facility]

## 2020-11-28 NOTE — Patient Instructions (Signed)
Preventive Care 40-58 Years Old, Female Preventive care refers to lifestyle choices and visits with your health care provider that can promote health and wellness. This includes: A yearly physical exam. This is also called an annual wellness visit. Regular dental and eye exams. Immunizations. Screening for certain conditions. Healthy lifestyle choices, such as: Eating a healthy diet. Getting regular exercise. Not using drugs or products that contain nicotine and tobacco. Limiting alcohol use. What can I expect for my preventive care visit? Physical exam Your health care provider will check your: Height and weight. These may be used to calculate your BMI (body mass index). BMI is a measurement that tells if you are at a healthy weight. Heart rate and blood pressure. Body temperature. Skin for abnormal spots. Counseling Your health care provider may ask you questions about your: Past medical problems. Family's medical history. Alcohol, tobacco, and drug use. Emotional well-being. Home life and relationship well-being. Sexual activity. Diet, exercise, and sleep habits. Work and work environment. Access to firearms. Method of birth control. Menstrual cycle. Pregnancy history. What immunizations do I need? Vaccines are usually given at various ages, according to a schedule. Your health care provider will recommend vaccines for you based on your age, medical history, and lifestyle or other factors, such as travel or where you work. What tests do I need? Blood tests Lipid and cholesterol levels. These may be checked every 5 years, or more often if you are over 50 years old. Hepatitis C test. Hepatitis B test. Screening Lung cancer screening. You may have this screening every year starting at age 55 if you have a 30-pack-year history of smoking and currently smoke or have quit within the past 15 years. Colorectal cancer screening. All adults should have this screening starting at  age 50 and continuing until age 75. Your health care provider may recommend screening at age 45 if you are at increased risk. You will have tests every 1-10 years, depending on your results and the type of screening test. Diabetes screening. This is done by checking your blood sugar (glucose) after you have not eaten for a while (fasting). You may have this done every 1-3 years. Mammogram. This may be done every 1-2 years. Talk with your health care provider about when you should start having regular mammograms. This may depend on whether you have a family history of breast cancer. BRCA-related cancer screening. This may be done if you have a family history of breast, ovarian, tubal, or peritoneal cancers. Pelvic exam and Pap test. This may be done every 3 years starting at age 21. Starting at age 30, this may be done every 5 years if you have a Pap test in combination with an HPV test. Other tests STD (sexually transmitted disease) testing, if you are at risk. Bone density scan. This is done to screen for osteoporosis. You may have this scan if you are at high risk for osteoporosis. Talk with your health care provider about your test results, treatment options, and if necessary, the need for more tests. Follow these instructions at home: Eating and drinking  Eat a diet that includes fresh fruits and vegetables, whole grains, lean protein, and low-fat dairy products. Take vitamin and mineral supplements as recommended by your health care provider. Do not drink alcohol if: Your health care provider tells you not to drink. You are pregnant, may be pregnant, or are planning to become pregnant. If you drink alcohol: Limit how much you have to 0-1 drink a day. Be   aware of how much alcohol is in your drink. In the [place] one drink equals one 12 oz bottle of beer (355 mL), one 5 oz glass of wine (148 mL), or one 1 oz glass of hard liquor (44 mL). Lifestyle Take daily care of your teeth and  gums. Brush your teeth every morning and night with fluoride toothpaste. Floss one time each day. Stay active. Exercise for at least 30 minutes 5 or more days each week. Do not use any products that contain nicotine or tobacco, such as cigarettes, e-cigarettes, and chewing tobacco. If you need help quitting, ask your health care provider. Do not use drugs. If you are sexually active, practice safe sex. Use a condom or other form of protection to prevent STIs (sexually transmitted infections). If you do not wish to become pregnant, use a form of birth control. If you plan to become pregnant, see your health care provider for a prepregnancy visit. If told by your health care provider, take low-dose aspirin daily starting at age 63. Find healthy ways to cope with stress, such as: Meditation, yoga, or listening to music. Journaling. Talking to a trusted person. Spending time with friends and family. Safety Always wear your seat belt while driving or riding in a vehicle. Do not drive: If you have been drinking alcohol. Do not ride with someone who has been drinking. When you are tired or distracted. While texting. Wear a helmet and other protective equipment during sports activities. If you have firearms in your house, make sure you follow all gun safety procedures. What's next? Visit your health care provider once a year for an annual wellness visit. Ask your health care provider how often you should have your eyes and teeth checked. Stay up to date on all vaccines. This information is not intended to replace advice given to you by your health care provider. Make sure you discuss any questions you have with your health care provider. Document Revised: 05/03/2020 Document Reviewed: 11/04/2017 Elsevier Patient Education  2022 [employer].

## 2020-11-29 LAB — CBC WITH DIFFERENTIAL/PLATELET
Absolute Monocytes: 428 cells/uL (ref 200–950)
Basophils Absolute: 41 cells/uL (ref 0–200)
Basophils Relative: 0.9 %
Eosinophils Absolute: 60 cells/uL (ref 15–500)
Eosinophils Relative: 1.3 %
HCT: 41.6 % (ref 35.0–45.0)
Hemoglobin: 13.6 g/dL (ref 11.7–15.5)
Lymphs Abs: 1550 cells/uL (ref 850–3900)
MCH: 29.1 pg (ref 27.0–33.0)
MCHC: 32.7 g/dL (ref 32.0–36.0)
MCV: 89.1 fL (ref 80.0–100.0)
MPV: 11.1 fL (ref 7.5–12.5)
Monocytes Relative: 9.3 %
Neutro Abs: 2521 cells/uL (ref 1500–7800)
Neutrophils Relative %: 54.8 %
Platelets: 231 10*3/uL (ref 140–400)
RBC: 4.67 10*6/uL (ref 3.80–5.10)
RDW: 12.4 % (ref 11.0–15.0)
Total Lymphocyte: 33.7 %
WBC: 4.6 10*3/uL (ref 3.8–10.8)

## 2020-11-29 LAB — COMPLETE METABOLIC PANEL WITH GFR
AG Ratio: 1.8 (calc) (ref 1.0–2.5)
ALT: 15 U/L (ref 6–29)
AST: 15 U/L (ref 10–35)
Albumin: 4.6 g/dL (ref 3.6–5.1)
Alkaline phosphatase (APISO): 64 U/L (ref 37–153)
BUN: 13 mg/dL (ref 7–25)
CO2: 31 mmol/L (ref 20–32)
Calcium: 9.8 mg/dL (ref 8.6–10.4)
Chloride: 100 mmol/L (ref 98–110)
Creat: 0.74 mg/dL (ref 0.50–1.03)
Globulin: 2.6 g/dL (calc) (ref 1.9–3.7)
Glucose, Bld: 87 mg/dL (ref 65–139)
Potassium: 4.6 mmol/L (ref 3.5–5.3)
Sodium: 138 mmol/L (ref 135–146)
Total Bilirubin: 0.4 mg/dL (ref 0.2–1.2)
Total Protein: 7.2 g/dL (ref 6.1–8.1)
eGFR: 94 mL/min/{1.73_m2} (ref >=60)

## 2020-11-29 LAB — LIPID PANEL
Cholesterol: 242 mg/dL — ABNORMAL HIGH (ref <200)
HDL: 74 mg/dL (ref >=50)
LDL Cholesterol (Calc): 146 mg/dL (calc) — ABNORMAL HIGH
Non-HDL Cholesterol (Calc): 168 mg/dL (calc) — ABNORMAL HIGH (ref <130)
Total CHOL/HDL Ratio: 3.3 (calc) (ref <5.0)
Triglycerides: 108 mg/dL (ref <150)

## 2020-11-29 LAB — TSH: TSH: 3.79 mIU/L (ref 0.40–4.50)

## 2021-01-22 DIAGNOSIS — M546 Pain in thoracic spine: Secondary | ICD-10-CM | POA: Diagnosis not present

## 2021-01-22 DIAGNOSIS — N3000 Acute cystitis without hematuria: Secondary | ICD-10-CM | POA: Diagnosis not present

## 2021-02-27 ENCOUNTER — Other Ambulatory Visit: Payer: Self-pay

## 2021-02-27 ENCOUNTER — Ambulatory Visit (INDEPENDENT_AMBULATORY_CARE_PROVIDER_SITE_OTHER): Payer: BC Managed Care – PPO | Admitting: Sports Medicine

## 2021-02-27 ENCOUNTER — Ambulatory Visit (INDEPENDENT_AMBULATORY_CARE_PROVIDER_SITE_OTHER): Payer: BC Managed Care – PPO

## 2021-02-27 DIAGNOSIS — M47812 Spondylosis without myelopathy or radiculopathy, cervical region: Secondary | ICD-10-CM | POA: Diagnosis not present

## 2021-02-27 DIAGNOSIS — R202 Paresthesia of skin: Secondary | ICD-10-CM | POA: Diagnosis not present

## 2021-02-27 DIAGNOSIS — M5134 Other intervertebral disc degeneration, thoracic region: Secondary | ICD-10-CM | POA: Diagnosis not present

## 2021-02-27 MED ORDER — PREDNISONE 50 MG PO TABS: ORAL_TABLET | ORAL | 0 refills | Status: DC

## 2021-02-27 NOTE — Progress Notes (Signed)
° ° °  Procedures performed today:    None.  Independent interpretation of notes and tests performed by another provider:   None.  Brief History, Exam, Impression, and Recommendations:    Cervical spondylosis [place] [name] May is a very pleasant 60 year old female, she has a several month history of discomfort described as itching, potentially numbness in the right periscapular region. Nothing overtly radicular into the hand to the fingertips. Often potentially worse with prolonged downgaze. I do think she has some cervical spondylosis, DDD. Adding cervical and thoracic spine x-rays, cervical spine conditioning, 5 days of prednisone. Turn to see me in 4 to 6 weeks, MRI if no better.    ___________________________________________ Gwen Her. Dianah Field, M.D., ABFM., CAQSM. Primary Care and [place] Instructor of [place] [employer] [facility]

## 2021-02-27 NOTE — Assessment & Plan Note (Signed)
[name] May is a very pleasant 60 year old female, she has a several month history of discomfort described as itching, potentially numbness in the right periscapular region. Nothing overtly radicular into the hand to the fingertips. Often potentially worse with prolonged downgaze. I do think she has some cervical spondylosis, DDD. Adding cervical and thoracic spine x-rays, cervical spine conditioning, 5 days of prednisone. Turn to see me in 4 to 6 weeks, MRI if no better.

## 2021-04-10 ENCOUNTER — Ambulatory Visit: Payer: BC Managed Care – PPO | Admitting: Sports Medicine

## 2021-05-09 ENCOUNTER — Encounter: Payer: Self-pay | Admitting: Medical-Surgical

## 2021-05-09 DIAGNOSIS — R195 Other fecal abnormalities: Secondary | ICD-10-CM

## 2021-05-19 DIAGNOSIS — Z1211 Encounter for screening for malignant neoplasm of colon: Secondary | ICD-10-CM | POA: Diagnosis not present

## 2021-05-26 LAB — COLOGUARD: COLOGUARD: POSITIVE — AB

## 2021-06-01 NOTE — Addendum Note (Signed)
Addended bySamuel Bouche on: 06/01/2021 11:32 PM ? ? Modules accepted: Orders ? ?

## 2021-06-18 ENCOUNTER — Telehealth: Payer: Self-pay

## 2021-06-18 NOTE — Telephone Encounter (Signed)
Faxed Correction for Diagnosis Code to [employer] to [phone] ? ?Fax confirmation Successful ?

## 2021-08-01 ENCOUNTER — Encounter: Payer: Self-pay | Admitting: Neurology

## 2021-08-01 NOTE — Telephone Encounter (Signed)
Mychart message sent to patient.

## 2021-09-17 ENCOUNTER — Ambulatory Visit: Payer: BC Managed Care – PPO | Admitting: Medical-Surgical

## 2021-09-26 ENCOUNTER — Encounter: Payer: Self-pay | Admitting: Medical-Surgical

## 2021-09-26 ENCOUNTER — Telehealth (INDEPENDENT_AMBULATORY_CARE_PROVIDER_SITE_OTHER): Payer: BC Managed Care – PPO | Admitting: Medical-Surgical

## 2021-09-26 DIAGNOSIS — R200 Anesthesia of skin: Secondary | ICD-10-CM

## 2021-09-26 NOTE — Progress Notes (Signed)
Virtual Visit via Video Note  I connected with [name] May on 09/26/21 at 11:10 AM EDT by a video enabled telemedicine application and verified that I am speaking with the correct person using two identifiers.   I discussed the limitations of evaluation and management by telemedicine and the availability of in person appointments. The patient expressed understanding and agreed to proceed.  Patient location: home Provider locations: office  Subjective:    CC: numbness of finger  HPI: Pleasant 60 year old female presenting today for discussion about a spot on her hand that, when touched, causes numbness in her finger.  Notes that last week, the spot on the dorsal right hand at the second MCP joint was extremely sensitive and even her sleeve brushing over the spot caused a tingling sensation to shoot down her index finger.  Notes that the distal part of the index finger is now numb on the dorsal side.  She also has an abnormal growth of her index finger nail with a longitudinal groove.  She does not remember any particular injury to her hand or fingernail and has not found anything that will help with the tingling or numbness.  She does note that over the past week, her symptoms have improved although they have not completely gone away.  Past medical history, Surgical history, Family history not pertinant except as noted below, Social history, Allergies, and medications have been entered into the medical record, reviewed, and corrections made.   Review of Systems: See HPI for pertinent positives and negatives.   Objective:    General: Speaking clearly in complete sentences without any shortness of breath.  Alert and oriented x3.  Normal judgment. No apparent acute distress.  Impression and Recommendations:    1. Numbness of finger Unclear etiology.  Difficulty with further assessment given the virtual nature of the visit.  We will start with hand x-rays to get baseline information.   She is going out of town to [place] next week so she will plan to get these images when she comes back.  Once her images are done, if she continues to have the symptoms, would recommend she follow-up in office with Dr. Dianah Field. - DG Hand Complete Right; Future  I discussed the assessment and treatment plan with the patient. The patient was provided an opportunity to ask questions and all were answered. The patient agreed with the plan and demonstrated an understanding of the instructions.   The patient was advised to call back or seek an in-person evaluation if the symptoms worsen or if the condition fails to improve as anticipated.  20 minutes of non-face-to-face time was provided during this encounter.  Return if symptoms worsen or fail to improve.  Clearnce Sorrel, DNP, APRN, FNP-BC [facility]

## 2022-03-30 DIAGNOSIS — L821 Other seborrheic keratosis: Secondary | ICD-10-CM | POA: Diagnosis not present

## 2022-03-30 DIAGNOSIS — L570 Actinic keratosis: Secondary | ICD-10-CM | POA: Diagnosis not present

## 2022-03-30 DIAGNOSIS — L718 Other rosacea: Secondary | ICD-10-CM | POA: Diagnosis not present

## 2022-05-08 ENCOUNTER — Ambulatory Visit: Payer: BC Managed Care – PPO | Admitting: Medical-Surgical

## 2022-05-19 DIAGNOSIS — L820 Inflamed seborrheic keratosis: Secondary | ICD-10-CM | POA: Diagnosis not present

## 2022-06-01 ENCOUNTER — Ambulatory Visit (INDEPENDENT_AMBULATORY_CARE_PROVIDER_SITE_OTHER): Payer: BC Managed Care – PPO | Admitting: Medical-Surgical

## 2022-06-01 ENCOUNTER — Encounter: Payer: Self-pay | Admitting: Medical-Surgical

## 2022-06-01 VITALS — BP 145/75 | HR 74 | Resp 20 | Ht 61.5 in | Wt 122.1 lb

## 2022-06-01 DIAGNOSIS — Z1231 Encounter for screening mammogram for malignant neoplasm of breast: Secondary | ICD-10-CM

## 2022-06-01 DIAGNOSIS — Z0000 Encounter for general adult medical examination without abnormal findings: Secondary | ICD-10-CM

## 2022-06-01 DIAGNOSIS — R195 Other fecal abnormalities: Secondary | ICD-10-CM | POA: Diagnosis not present

## 2022-06-01 DIAGNOSIS — Z23 Encounter for immunization: Secondary | ICD-10-CM

## 2022-06-01 DIAGNOSIS — E7800 Pure hypercholesterolemia, unspecified: Secondary | ICD-10-CM | POA: Diagnosis not present

## 2022-06-01 DIAGNOSIS — F419 Anxiety disorder, unspecified: Secondary | ICD-10-CM | POA: Diagnosis not present

## 2022-06-01 MED ORDER — HYDROXYZINE PAMOATE 25 MG PO CAPS: 25.0000 mg | ORAL_CAPSULE | Freq: Three times a day (TID) | ORAL | 0 refills | Status: DC | PRN

## 2022-06-01 NOTE — Progress Notes (Signed)
Complete physical exam  Patient: [name] May   DOB: 08-12-1960   62 y.o. Female  MRN: [id number]  Subjective:    Chief Complaint  Patient presents with   Annual Exam   [name] May is a 62 y.o. female who presents today for a complete physical exam. She reports consuming a general diet.  Teaching pilates.   She generally feels well. She reports sleeping poorly. She does not have additional problems to discuss today.    Most recent fall risk assessment:    06/01/2022    3:13 PM  [place] in the past year? 0  Number falls in past yr: 0  Injury with Fall? 0  Risk for fall due to : No Fall Risks  Follow up Falls evaluation completed     Most recent depression screenings:    06/01/2022    3:13 PM 11/28/2020   10:50 AM  PHQ 2/9 Scores  PHQ - 2 Score 1 0    Vision:Within last year, Dental: No current dental problems and Receives regular dental care, and STD: The patient denies history of sexually transmitted disease.    Patient Care Team: [name] Bouche, NP as PCP - General (Nurse Practitioner)   Outpatient Medications Prior to Visit  Medication Sig   cetirizine (ZYRTEC) 10 MG tablet Take 10 mg by mouth daily.   Melatonin 1 MG TABS Take 1 mg by mouth at bedtime.   mometasone (NASONEX) 50 MCG/ACT nasal spray Place 2 sprays into the nose daily.   Multiple Vitamin (MULTIVITAMIN) capsule Take 1 capsule by mouth daily.   No facility-administered medications prior to visit.    Review of Systems  Constitutional:  Negative for chills, fever, malaise/fatigue and weight loss.       Brain fog, intermittent hot flashes/night sweats  HENT:  Negative for congestion, ear pain, hearing loss, sinus pain and sore throat.   Eyes:  Negative for blurred vision, photophobia and pain.  Respiratory:  Negative for cough, shortness of breath and wheezing.   Cardiovascular:  Negative for chest pain, palpitations and leg swelling.  Gastrointestinal:  Negative for abdominal  pain, constipation, diarrhea, heartburn, nausea and vomiting.  Genitourinary:  Negative for dysuria, frequency and urgency.  Musculoskeletal:  Negative for falls and neck pain.  Skin:  Negative for itching and rash.  Neurological:  Negative for dizziness, weakness and headaches.  Endo/Heme/Allergies:  Negative for polydipsia. Does not bruise/bleed easily.  Psychiatric/Behavioral:  Negative for depression, substance abuse and suicidal ideas. The patient is nervous/anxious and has insomnia.      Objective:    BP (!) 145/75 (BP Location: Left Arm, Cuff Size: Normal)   Pulse 74   Resp 20   Ht 5' 1.5" (1.562 m)   Wt 122 lb 1.6 oz (55.4 kg)   SpO2 100%   BMI 22.70 kg/m    Physical Exam Vitals reviewed.  Constitutional:      General: She is not in acute distress.    Appearance: Normal appearance. She is not ill-appearing.  HENT:     Head: Normocephalic and atraumatic.     Right Ear: Tympanic membrane, ear canal and external ear normal. There is no impacted cerumen.     Left Ear: Tympanic membrane, ear canal and external ear normal. There is no impacted cerumen.     Nose: Nose normal. No congestion or rhinorrhea.     Mouth/Throat:     Mouth: Mucous membranes are moist.     Pharynx: No  oropharyngeal exudate or posterior oropharyngeal erythema.  Eyes:     General: No scleral icterus.       Right eye: No discharge.        Left eye: No discharge.     Extraocular Movements: Extraocular movements intact.     Conjunctiva/sclera: Conjunctivae normal.     Pupils: Pupils are equal, round, and reactive to light.  Neck:     Thyroid: No thyromegaly.     Vascular: No carotid bruit or JVD.     Trachea: Trachea normal.  Cardiovascular:     Rate and Rhythm: Normal rate and regular rhythm.     Pulses: Normal pulses.     Heart sounds: Normal heart sounds. No murmur heard.    No friction rub. No gallop.  Pulmonary:     Effort: Pulmonary effort is normal. No respiratory distress.     Breath  sounds: Normal breath sounds. No wheezing.  Abdominal:     General: Bowel sounds are normal. There is no distension.     Palpations: Abdomen is soft.     Tenderness: There is no abdominal tenderness. There is no guarding.  Musculoskeletal:        General: Normal range of motion.     Cervical back: Normal range of motion and neck supple.  Lymphadenopathy:     Cervical: No cervical adenopathy.  Skin:    General: Skin is warm and dry.  Neurological:     Mental Status: She is alert and oriented to person, place, and time.     Cranial Nerves: No cranial nerve deficit.  Psychiatric:        Mood and Affect: Mood normal.        Behavior: Behavior normal.        Thought Content: Thought content normal.        Judgment: Judgment normal.   No results found for any visits on 06/01/22.     Assessment & Plan:    Routine Health Maintenance and Physical Exam  Immunization History  Administered Date(s) Administered   Hep A / Hep B 05/02/2010, 07/18/2010   PFIZER(Purple Top)SARS-COV-2 Vaccination 04/30/2019, 05/27/2019, 01/21/2020, 08/21/2020   Tdap 06/16/2010, 06/01/2022   Zoster Recombinat (Shingrix) 10/23/2019, 11/28/2020    Health Maintenance  Topic Date Due   MAMMOGRAM  11/16/2020   INFLUENZA VACCINE  06/07/2022 (Originally 10/07/2021)   COVID-19 Vaccine (5 - 2023-24 season) 06/17/2022 (Originally 11/07/2021)   HIV Screening  05/20/2026 (Originally 01/02/1978)   PAP SMEAR-Modifier  11/17/2023   COLONOSCOPY (Pts 45-58yrs Insurance coverage will need to be confirmed)  12/30/2027   DTaP/Tdap/Td (3 - Td or Tdap) 05/31/2032   Hepatitis C Screening  Completed   Zoster Vaccines- Shingrix  Completed   HPV VACCINES  Aged Out   Fecal DNA (Cologuard)  Discontinued    Discussed health benefits of physical activity, and encouraged her to engage in regular exercise appropriate for her age and condition.  1. Positive colorectal cancer screening using Cologuard test Had a positive Cologuard last  year but did not follow through with the recommended colonoscopy. Referring to GI.  - Ambulatory referral to Gastroenterology  2. Annual physical exam Checking labs. UTD on preventative care. Wellness information provided with AVS.  - CBC with Differential/Platelet - COMPLETE METABOLIC PANEL WITH GFR - Lipid panel  3. Encounter for screening mammogram for malignant neoplasm of breast Mammogram ordered.  - MM DIGITAL SCREENING BILATERAL; Future  4. Anxiety Adding hydroxyzine at bedtime to help with anxiety and sleep. Discussed  other options such as Effexor, Pristiq, and BuSpar. Believe that she may see multiple benefits from adding a SNRI given her menopausal symptoms. She would like to investigate these options further and will let me know if she would like to proceed with trying one of them.  - hydrOXYzine (VISTARIL) 25 MG capsule; Take 1 capsule (25 mg total) by mouth every 8 (eight) hours as needed.  Dispense: 30 capsule; Refill: 0  5. Need for tetanus booster Tdap given in office today.  - Tdap vaccine greater than or equal to 7yo IM  Return in about 1 year (around 06/01/2023) for annual physical exam or sooner if needed.   [name] Bouche, NP

## 2022-06-02 LAB — CBC WITH DIFFERENTIAL/PLATELET
Absolute Monocytes: 618 cells/uL (ref 200–950)
Basophils Absolute: 42 cells/uL (ref 0–200)
Basophils Relative: 0.7 %
Eosinophils Absolute: 48 cells/uL (ref 15–500)
Eosinophils Relative: 0.8 %
HCT: 41.5 % (ref 35.0–45.0)
Hemoglobin: 13.9 g/dL (ref 11.7–15.5)
Lymphs Abs: 1848 cells/uL (ref 850–3900)
MCH: 29.2 pg (ref 27.0–33.0)
MCHC: 33.5 g/dL (ref 32.0–36.0)
MCV: 87.2 fL (ref 80.0–100.0)
MPV: 11.3 fL (ref 7.5–12.5)
Monocytes Relative: 10.3 %
Neutro Abs: 3444 cells/uL (ref 1500–7800)
Neutrophils Relative %: 57.4 %
Platelets: 273 10*3/uL (ref 140–400)
RBC: 4.76 10*6/uL (ref 3.80–5.10)
RDW: 12 % (ref 11.0–15.0)
Total Lymphocyte: 30.8 %
WBC: 6 10*3/uL (ref 3.8–10.8)

## 2022-06-02 LAB — COMPLETE METABOLIC PANEL WITH GFR
AG Ratio: 1.7 (calc) (ref 1.0–2.5)
ALT: 24 U/L (ref 6–29)
AST: 22 U/L (ref 10–35)
Albumin: 5.1 g/dL (ref 3.6–5.1)
Alkaline phosphatase (APISO): 68 U/L (ref 37–153)
BUN: 14 mg/dL (ref 7–25)
CO2: 27 mmol/L (ref 20–32)
Calcium: 10.4 mg/dL (ref 8.6–10.4)
Chloride: 101 mmol/L (ref 98–110)
Creat: 0.73 mg/dL (ref 0.50–1.03)
Globulin: 3 g/dL (calc) (ref 1.9–3.7)
Glucose, Bld: 90 mg/dL (ref 65–99)
Potassium: 4.4 mmol/L (ref 3.5–5.3)
Sodium: 139 mmol/L (ref 135–146)
Total Bilirubin: 0.5 mg/dL (ref 0.2–1.2)
Total Protein: 8.1 g/dL (ref 6.1–8.1)
eGFR: 95 mL/min/{1.73_m2} (ref >=60)

## 2022-06-02 LAB — LIPID PANEL
Cholesterol: 287 mg/dL — ABNORMAL HIGH (ref <200)
HDL: 96 mg/dL (ref >=50)
LDL Cholesterol (Calc): 172 mg/dL (calc) — ABNORMAL HIGH
Non-HDL Cholesterol (Calc): 191 mg/dL (calc) — ABNORMAL HIGH (ref <130)
Total CHOL/HDL Ratio: 3 (calc) (ref <5.0)
Triglycerides: 82 mg/dL (ref <150)

## 2022-06-04 ENCOUNTER — Encounter: Payer: BC Managed Care – PPO | Admitting: Medical-Surgical

## 2022-07-15 ENCOUNTER — Ambulatory Visit (INDEPENDENT_AMBULATORY_CARE_PROVIDER_SITE_OTHER): Payer: BC Managed Care – PPO

## 2022-07-15 DIAGNOSIS — Z1231 Encounter for screening mammogram for malignant neoplasm of breast: Secondary | ICD-10-CM

## 2022-07-31 ENCOUNTER — Ambulatory Visit (AMBULATORY_SURGERY_CENTER): Payer: BC Managed Care – PPO

## 2022-07-31 VITALS — Ht 61.5 in | Wt 120.0 lb

## 2022-07-31 DIAGNOSIS — R195 Other fecal abnormalities: Secondary | ICD-10-CM

## 2022-07-31 MED ORDER — NA SULFATE-K SULFATE-MG SULF 17.5-3.13-1.6 GM/177ML PO SOLN: 1.0000 | Freq: Once | ORAL | 0 refills | Status: AC

## 2022-07-31 NOTE — Progress Notes (Signed)
No egg or soy allergy known to patient  No issues known to pt with past sedation with any surgeries or procedures - convulsions/jerky movements with waking up from sedation per patient  Patient denies ever being told they had issues or difficulty with intubation  No FH of Malignant Hyperthermia Pt is not on diet pills Pt is not on  home 02  Pt is not on blood thinners  Pt denies issues with constipation  No A fib or A flutter Have any cardiac testing pending--no  Pt is ambulatory   Patient's chart reviewed by Cathlyn Parsons CNRA prior to previsit and patient appropriate for the [facility].  Previsit completed and red dot placed by patient's name on their procedure day (on provider's schedule).     PV completed with patient. Prep instructions sent via mychart and to home address.Goodrx coupon provided for [facility].  Pt instructed to use Singlecare.com or GoodRx for a price reduction on prep

## 2022-08-14 ENCOUNTER — Encounter: Payer: Self-pay | Admitting: Gastroenterology

## 2022-08-28 ENCOUNTER — Encounter: Payer: Self-pay | Admitting: Gastroenterology

## 2022-08-28 ENCOUNTER — Ambulatory Visit (AMBULATORY_SURGERY_CENTER): Payer: BC Managed Care – PPO | Admitting: Gastroenterology

## 2022-08-28 VITALS — BP 104/60 | HR 60 | Temp 98.9°F | Resp 17 | Ht 61.5 in | Wt 120.0 lb

## 2022-08-28 DIAGNOSIS — Z1211 Encounter for screening for malignant neoplasm of colon: Secondary | ICD-10-CM | POA: Diagnosis not present

## 2022-08-28 DIAGNOSIS — R195 Other fecal abnormalities: Secondary | ICD-10-CM | POA: Diagnosis not present

## 2022-08-28 DIAGNOSIS — D123 Benign neoplasm of transverse colon: Secondary | ICD-10-CM

## 2022-08-28 MED ORDER — SODIUM CHLORIDE 0.9 % IV SOLN: 500.0000 mL | Freq: Once | INTRAVENOUS | Status: DC

## 2022-08-28 NOTE — Op Note (Signed)
[facility] Patient Name: [name] May Procedure Date: 08/28/2022 8:57 AM MRN: [id number] Endoscopist: Meryl Dare , MD, [phone] Age: 62 Referring MD:  Date of Birth: August 16, 1960 Gender: Female Account #: [id number] Procedure:                Colonoscopy Indications:              Positive Cologuard test Medicines:                Monitored Anesthesia Care Procedure:                Pre-Anesthesia Assessment:                           - Prior to the procedure, a History and Physical                            was performed, and patient medications and                            allergies were reviewed. The patient's tolerance of                            previous anesthesia was also reviewed. The risks                            and benefits of the procedure and the sedation                            options and risks were discussed with the patient.                            All questions were answered, and informed consent                            was obtained. Prior Anticoagulants: The patient has                            taken no anticoagulant or antiplatelet agents. ASA                            Grade Assessment: II - A patient with mild systemic                            disease. After reviewing the risks and benefits,                            the patient was deemed in satisfactory condition to                            undergo the procedure.                           After obtaining informed consent, the colonoscope  was passed under direct vision. Throughout the                            procedure, the patient's blood pressure, pulse, and                            oxygen saturations were monitored continuously. The                            Olympus SN [id number] was introduced through the anus                            and advanced to the the cecum, identified by                            appendiceal orifice and  ileocecal valve. The                            ileocecal valve, appendiceal orifice, and rectum                            were photographed. The quality of the bowel                            preparation was good. The colonoscopy was performed                            without difficulty. The patient tolerated the                            procedure well. Scope In: 9:21:08 AM Scope Out: 9:32:52 AM Scope Withdrawal Time: 0 hours 11 minutes 35 seconds  Total Procedure Duration: 0 hours 11 minutes 44 seconds  Findings:                 The perianal and digital rectal examinations were                            normal.                           A 6 mm polyp was found in the proximal transverse                            colon. The polyp was sessile. The polyp was removed                            with a cold snare. Resection and retrieval were                            complete.                           A few small-mouthed diverticula were found in the  sigmoid colon.                           The exam was otherwise without abnormality on                            direct and retroflexion views. Complications:            No immediate complications. Estimated blood loss:                            None. Estimated Blood Loss:     Estimated blood loss: none. Impression:               - One 6 mm polyp in the proximal transverse colon,                            removed with a cold snare. Resected and retrieved.                           - Mild diverticulosis in the sigmoid colon.                           - The examination was otherwise normal on direct                            and retroflexion views. Recommendation:           - Repeat colonoscopy after studies are complete for                            surveillance based on pathology results.                           - Patient has a contact number available for                            emergencies. The  signs and symptoms of potential                            delayed complications were discussed with the                            patient. Return to normal activities tomorrow.                            Written discharge instructions were provided to the                            patient.                           - Resume previous diet.                           - Continue present medications.                           -  Await pathology results. Meryl Dare, MD 08/28/2022 9:43:47 AM This report has been signed electronically.

## 2022-08-28 NOTE — Patient Instructions (Addendum)
Resume previous diet. Continue present medications. Await pathology results.   YOU HAD AN ENDOSCOPIC PROCEDURE TODAY AT THE [facility]:   Refer to the procedure report that was given to you for any specific questions about what was found during the examination.  If the procedure report does not answer your questions, please call your gastroenterologist to clarify.  If you requested that your care partner not be given the details of your procedure findings, then the procedure report has been included in a sealed envelope for you to review at your convenience later.  YOU SHOULD EXPECT: Some feelings of bloating in the abdomen. Passage of more gas than usual.  Walking can help get rid of the air that was put into your GI tract during the procedure and reduce the bloating. If you had a lower endoscopy (such as a colonoscopy or flexible sigmoidoscopy) you may notice spotting of blood in your stool or on the toilet paper. If you underwent a bowel prep for your procedure, you may not have a normal bowel movement for a few days.  Please Note:  You might notice some irritation and congestion in your nose or some drainage.  This is from the oxygen used during your procedure.  There is no need for concern and it should clear up in a day or so.  SYMPTOMS TO REPORT IMMEDIATELY:  Following lower endoscopy (colonoscopy or flexible sigmoidoscopy):  Excessive amounts of blood in the stool  Significant tenderness or worsening of abdominal pains  Swelling of the abdomen that is new, acute  Fever of 100F or higher   For urgent or emergent issues, a gastroenterologist can be reached at any hour by calling [phone]. Do not use MyChart messaging for urgent concerns.    DIET:  We do recommend a small meal at first, but then you may proceed to your regular diet.  Drink plenty of fluids but you should avoid alcoholic beverages for 24 hours.  ACTIVITY:  You should plan to take it easy for the  rest of today and you should NOT DRIVE or use heavy machinery until tomorrow (because of the sedation medicines used during the test).    FOLLOW UP: Our staff will call the number listed on your records the next business day following your procedure.  We will call around 7:15- 8:00 am to check on you and address any questions or concerns that you may have regarding the information given to you following your procedure. If we do not reach you, we will leave a message.     If any biopsies were taken you will be contacted by phone or by letter within the next 1-3 weeks.  Please call us at [phone] if you have not heard about the biopsies in 3 weeks.    SIGNATURES/CONFIDENTIALITY: You and/or your care partner have signed paperwork which will be entered into your electronic medical record.  These signatures attest to the fact that that the information above on your After Visit Summary has been reviewed and is understood.  Full responsibility of the confidentiality of this discharge information lies with you and/or your care-partner. 

## 2022-08-28 NOTE — Progress Notes (Signed)
A and O x3. Report to RN. Tolerated MAC anesthesia well. 

## 2022-08-28 NOTE — Progress Notes (Signed)
Called to room to assist during endoscopic procedure.  Patient ID and intended procedure confirmed with present staff. Received instructions for my participation in the procedure from the performing physician.  

## 2022-08-28 NOTE — Progress Notes (Signed)
History & Physical  Primary Care Physician:  Christen Butter, NP Primary Gastroenterologist: Claudette Head, MD  Impression / Plan:  Positive Cologuard for colonoscopy.  CHIEF COMPLAINT: Positive Cologuard  HPI: [name] May is a 62 y.o. female with a positive Cologuard for colonoscopy.   Past Medical History:  Diagnosis Date   Allergy    GERD (gastroesophageal reflux disease)    Infertility, female    Dr Chevis Pretty G1P0    Past Surgical History:  Procedure Laterality Date   CHOLECYSTECTOMY     ECTOPIC PREGNANCY SURGERY     laproscopy   REFRACTIVE SURGERY     UTERINE FIBROID SURGERY     polyp    Prior to Admission medications   Medication Sig Start Date End Date Taking? Authorizing Provider  cetirizine (ZYRTEC) 10 MG tablet Take 10 mg by mouth daily.   Yes [provider]  Melatonin 1 MG TABS Take 1 mg by mouth at bedtime.   Yes [provider]  mometasone (NASONEX) 50 MCG/ACT nasal spray Place 2 sprays into the nose daily.   Yes [provider]  Multiple Vitamin (MULTIVITAMIN) capsule Take 1 capsule by mouth daily.   Yes [provider]  hydrOXYzine (VISTARIL) 25 MG capsule Take 1 capsule (25 mg total) by mouth every 8 (eight) hours as needed. Patient not taking: Reported on 07/31/2022 06/01/22   Christen Butter, NP    Current Outpatient Medications  Medication Sig Dispense Refill   cetirizine (ZYRTEC) 10 MG tablet Take 10 mg by mouth daily.     Melatonin 1 MG TABS Take 1 mg by mouth at bedtime.     mometasone (NASONEX) 50 MCG/ACT nasal spray Place 2 sprays into the nose daily.     Multiple Vitamin (MULTIVITAMIN) capsule Take 1 capsule by mouth daily.     hydrOXYzine (VISTARIL) 25 MG capsule Take 1 capsule (25 mg total) by mouth every 8 (eight) hours as needed. (Patient not taking: Reported on 07/31/2022) 30 capsule 0   Current Facility-Administered Medications  Medication Dose Route Frequency Provider Last Rate Last Admin   0.9 %   sodium chloride infusion  500 mL Intravenous Once Meryl Dare, MD        Allergies as of 08/28/2022 - Review Complete 08/28/2022  Allergen Reaction Noted   Erythromycin Nausea Only 07/31/2022    Family History  Problem Relation Age of Onset   Pulmonary fibrosis Mother        deceased   Heart disease Father    Thyroid disease Sister    Collagen disease Sister    Thyroid disease Brother    Cervical cancer Paternal Grandmother    Coronary artery disease Other    Alcohol abuse Other    Colon cancer Neg Hx    Rectal cancer Neg Hx    Stomach cancer Neg Hx    Colon polyps Neg Hx    Esophageal cancer Neg Hx     Social History   Socioeconomic History   Marital status: Married    Spouse name: Not on file   Number of children: Not on file   Years of education: Not on file   Highest education level: Not on file  Occupational History   Not on file  Tobacco Use   Smoking status: Former    Packs/day: 0.25    Years: 2.00    Additional pack years: 0.00    Total pack years: 0.50    Types: Cigarettes    Quit date: 05/20/1983  Years since quitting: 39.3   Smokeless tobacco: Never  Vaping Use   Vaping Use: Never used  Substance and Sexual Activity   Alcohol use: Yes    Comment: 4 drinks/week, wine or beer   Drug use: Never   Sexual activity: Yes    Birth control/protection: Surgical  Other Topics Concern   Not on file  Social History Narrative   Married non smoker    Adoptive child   Social Determinants of Health   Financial Resource Strain: Not on file  Food Insecurity: Not on file  Transportation Needs: Not on file  Physical Activity: Not on file  Stress: Not on file  Social Connections: Not on file  Intimate Partner Violence: Not on file    Review of Systems:  All systems reviewed were negative except where noted in HPI.   Physical Exam:  General:  Alert, well-developed, in NAD Head:  Normocephalic and atraumatic. Eyes:  Sclera clear, no icterus.    Conjunctiva pink. Ears:  Normal auditory acuity. Mouth:  No deformity or lesions.  Neck:  Supple; no masses. Lungs:  Clear throughout to auscultation.   No wheezes, crackles, or rhonchi.  Heart:  Regular rate and rhythm; no murmurs. Abdomen:  Soft, nondistended, nontender. No masses, hepatomegaly. No palpable masses.  Normal bowel sounds.    Rectal:  Deferred   Msk:  Symmetrical without gross deformities. Extremities:  Without edema. Neurologic:  Alert and  oriented x 4; grossly normal neurologically. Skin:  Intact without significant lesions or rashes. Psych:  Alert and cooperative. Normal mood and affect.   Venita Lick. Russella Dar  08/28/2022, 9:01 AM See Loretha Stapler,  GI, to contact our on call provider

## 2022-08-28 NOTE — Progress Notes (Signed)
VS completed by Lino Lakes.   Pt's states no medical or surgical changes since previsit or office visit.  

## 2022-09-01 ENCOUNTER — Telehealth: Payer: Self-pay

## 2022-09-01 NOTE — Telephone Encounter (Signed)
Attempted f/u call. No answer, left VM. 

## 2022-09-13 ENCOUNTER — Encounter: Payer: Self-pay | Admitting: Gastroenterology

## 2022-11-19 ENCOUNTER — Encounter: Payer: Self-pay | Admitting: Medical-Surgical

## 2022-11-19 DIAGNOSIS — M2550 Pain in unspecified joint: Secondary | ICD-10-CM

## 2022-11-19 DIAGNOSIS — R5383 Other fatigue: Secondary | ICD-10-CM

## 2022-11-19 DIAGNOSIS — W57XXXA Bitten or stung by nonvenomous insect and other nonvenomous arthropods, initial encounter: Secondary | ICD-10-CM

## 2022-11-25 DIAGNOSIS — M2550 Pain in unspecified joint: Secondary | ICD-10-CM | POA: Diagnosis not present

## 2022-11-25 DIAGNOSIS — R5383 Other fatigue: Secondary | ICD-10-CM | POA: Diagnosis not present

## 2022-11-25 DIAGNOSIS — W57XXXA Bitten or stung by nonvenomous insect and other nonvenomous arthropods, initial encounter: Secondary | ICD-10-CM | POA: Diagnosis not present

## 2022-11-27 LAB — TICKBORNE DISEASE ANTIBODY PROFILE, SERUM
Babesia microti IgG: <1:10 {titer}
E.Chaffeensis (HME) IgG: NEGATIVE
HGE IgG Titer: NEGATIVE
Lyme Total Antibody EIA: NEGATIVE

## 2022-12-07 ENCOUNTER — Ambulatory Visit (INDEPENDENT_AMBULATORY_CARE_PROVIDER_SITE_OTHER): Payer: BC Managed Care – PPO | Admitting: Medical-Surgical

## 2022-12-07 ENCOUNTER — Other Ambulatory Visit: Payer: Self-pay | Admitting: Medical-Surgical

## 2022-12-07 ENCOUNTER — Encounter: Payer: Self-pay | Admitting: Medical-Surgical

## 2022-12-07 VITALS — BP 111/70 | HR 63 | Resp 20 | Ht 61.5 in | Wt 124.3 lb

## 2022-12-07 DIAGNOSIS — Z23 Encounter for immunization: Secondary | ICD-10-CM

## 2022-12-07 DIAGNOSIS — M2550 Pain in unspecified joint: Secondary | ICD-10-CM

## 2022-12-07 DIAGNOSIS — N941 Unspecified dyspareunia: Secondary | ICD-10-CM

## 2022-12-07 DIAGNOSIS — J310 Chronic rhinitis: Secondary | ICD-10-CM

## 2022-12-07 MED ORDER — LEVOCETIRIZINE DIHYDROCHLORIDE 5 MG PO TABS: 5.0000 mg | ORAL_TABLET | Freq: Every evening | ORAL | 11 refills | Status: AC

## 2022-12-07 MED ORDER — AZELASTINE-FLUTICASONE 137-50 MCG/ACT NA SUSP
1.0000 | Freq: Two times a day (BID) | NASAL | 3 refills | Status: AC
Start: 2022-12-07 — End: ?

## 2022-12-07 MED ORDER — ESTRADIOL 0.1 MG/GM VA CREA: 1.0000 | TOPICAL_CREAM | Freq: Every day | VAGINAL | 12 refills | Status: DC

## 2022-12-07 NOTE — Progress Notes (Signed)
        Established patient visit  History, exam, impression, and plan:  1. RHINITIS, CHRONIC Pleasant 62 year old female presenting today with a history of chronic rhinitis.  Notes that she has significant postnasal drip that causes difficulty with sleeping at night.  She constantly feels congested in the back of her throat and this wakes her multiple times throughout the night.  She has tried multiple agents before and is currently taking Zyrtec 10 mg nightly and using Nasonex nasal spray which is not helping much.  Knows that the allergy season for her is coming up in October and is worried that her symptoms may become worse.  Feels that her daytime sleepiness and overall fatigue is related to poor sleep at night due to chronic rhinitis.  Recommend switching from Zyrtec to Xyzal 5 mg nightly.  Discontinue Nasonex and trial Dymista. - Azelastine-Fluticasone 137-50 MCG/ACT SUSP; Place 1 spray into the nose every 12 (twelve) hours.  Dispense: 23 g; Refill: 3  2. Dyspareunia in female Reports that she continues to have intermittent issues with hot flashes and night sweats but this is common in women in her family and her sister suffers from the same despite being postmenopausal.  Of most concern is vaginal pain with attempt at intercourse.  She has purchased Vagisil but has been hesitant to use this.  Discussed vaginal atrophy and postmenopausal women.  This is likely the cause of dyspareunia for her.  Discussed various options including Replens, over-the-counter lubricants, and estrogen creams.  Notes that Replens is quite expensive and would like to see if insurance will cover an estrogen cream.  Sending Estrace with the instructions to use this nightly for 14 days then reduce to 2-3 times weekly.  3. Arthralgia, unspecified joint She has had widespread issues with arthralgias specifically in her knees and hands bilaterally.  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  She was concerned about possible tickborne disease however her lab for this came back negative.  Discussed using anti-inflammatories such as meloxicam, Advil, Aleve, etc.  Okay to use these on an as-needed basis if desired.  If symptoms worsen, return to discuss further options.  In the meantime, work on modifying activity to avoid exacerbation of symptoms.  4. Need for influenza vaccination Flu vaccine given in office today. - Flu vaccine trivalent PF, 6mos and older(Flulaval,Afluria,Fluarix,Fluzone)  Procedures performed this visit: None.  Return if symptoms worsen or fail to improve.  __________________________________ Thayer Ohm, DNP, APRN, FNP-BC Primary Care and Sports Medicine [facility] [place]

## 2023-06-03 ENCOUNTER — Encounter: Payer: BC Managed Care – PPO | Admitting: Medical-Surgical

## 2023-11-09 ENCOUNTER — Encounter: Payer: Self-pay | Admitting: Sports Medicine

## 2023-12-20 ENCOUNTER — Other Ambulatory Visit (HOSPITAL_BASED_OUTPATIENT_CLINIC_OR_DEPARTMENT_OTHER): Payer: Self-pay

## 2023-12-20 MED ORDER — FLUZONE 0.5 ML IM SUSY
0.5000 mL | PREFILLED_SYRINGE | Freq: Once | INTRAMUSCULAR | 0 refills | Status: AC
  Filled 2023-12-20: qty 0.5, 1d supply, fill #0

## 2024-04-13 ENCOUNTER — Ambulatory Visit: Admitting: Family Medicine

## 2024-04-13 ENCOUNTER — Encounter: Payer: Self-pay | Admitting: Family Medicine

## 2024-04-13 VITALS — BP 130/82 | HR 65 | Ht 62.0 in | Wt 129.0 lb

## 2024-04-13 DIAGNOSIS — Z124 Encounter for screening for malignant neoplasm of cervix: Secondary | ICD-10-CM

## 2024-04-13 DIAGNOSIS — Z1231 Encounter for screening mammogram for malignant neoplasm of breast: Secondary | ICD-10-CM

## 2024-04-13 DIAGNOSIS — N952 Postmenopausal atrophic vaginitis: Secondary | ICD-10-CM

## 2024-04-13 DIAGNOSIS — Z01411 Encounter for gynecological examination (general) (routine) with abnormal findings: Secondary | ICD-10-CM | POA: Diagnosis not present

## 2024-04-13 MED ORDER — ESTRADIOL 10 MCG VA TABS: 1.0000 | ORAL_TABLET | VAGINAL | 3 refills | Status: AC

## 2024-04-13 MED ORDER — PROGESTERONE MICRONIZED 100 MG PO CAPS: 100.0000 mg | ORAL_CAPSULE | Freq: Every day | ORAL | 3 refills | Status: AC

## 2024-04-13 NOTE — Assessment & Plan Note (Signed)
 [id number] - discussed at length with the patient. Advised of HRT risks and benefits.  Discussed bone loss, heart health, breast cancer.  Also discussed changes related to menopause, normal progression and resolution of symptoms.  She does not want to do full HRT. Would like to try vaginal estrogen, just not a cream which was messy. Will try Vagifem .

## 2024-04-13 NOTE — Progress Notes (Signed)
 Subjective:     [name] May is a 64 y.o. female and is here for a comprehensive physical exam. The patient reports problems - vaginal dryness.  Menopause x 5-6 years. Still has some hot flashes. Has some vaginal dryness and pain during intercourse. Has been on vaginal estrogen cream, and she did not tolerate this well. Seemed very messy, Did not work after 1.5 weeks.    The following portions of the patient's history were reviewed and updated as appropriate: allergies, current medications, past family history, past medical history, past social history, past surgical history, and problem list.  Review of Systems Pertinent items noted in HPI and remainder of comprehensive ROS otherwise negative.   Objective:  Chaperone present for exam   BP 130/82   Pulse 65   Ht 5' 2 (1.575 m)   Wt 129 lb (58.5 kg)   BMI 23.59 kg/m  General appearance: alert, cooperative, and appears stated age Head: Normocephalic, without obvious abnormality, atraumatic Neck: no adenopathy, supple, symmetrical, trachea midline, and thyroid  not enlarged, symmetric, no tenderness/mass/nodules Lungs: clear to auscultation bilaterally Breasts: normal appearance, no masses or tenderness Heart: regular rate and rhythm, S1, S2 normal, no murmur, click, rub or gallop Abdomen: soft, non-tender; bowel sounds normal; no masses,  no organomegaly Pelvic: cervix normal in appearance, external genitalia normal, no adnexal masses or tenderness, no cervical motion tenderness, uterus normal size, shape, and consistency, and vagina normal without discharge Extremities: extremities normal, atraumatic, no cyanosis or edema Pulses: 2+ and symmetric Skin: Skin color, texture, turgor normal. No rashes or lesions Lymph nodes: Cervical, supraclavicular, and axillary nodes normal. Neurologic: Grossly normal       04/13/2024    9:52 AM 10/23/2019    9:18 AM  GAD 7 : Generalized Anxiety Score  Nervous, Anxious, on Edge 1 0    Control/stop worrying 1 1   Worry too much - different things 1 1   Trouble relaxing 1 0   Restless 0 1   Easily annoyed or irritable 1 0   Afraid - awful might happen 1 0   Total GAD 7 Score 6 3  Anxiety Difficulty  Not difficult at all     Data saved with a previous flowsheet row definition    Flowsheet Row Office Visit from 04/13/2024 in [facility] for [facility]  PHQ-9 Total Score 6    Assessment:    Healthy female exam.      Plan:   Problem List Items Addressed This Visit       Unprioritized   Atrophic vaginitis   [id number] - discussed at length with the patient. Advised of HRT risks and benefits.  Discussed bone loss, heart health, breast cancer.  Also discussed changes related to menopause, normal progression and resolution of symptoms.  She does not want to do full HRT. Would like to try vaginal estrogen, just not a cream which was messy. Will try Vagifem .         Relevant Medications   Estradiol  10 MCG TABS vaginal tablet   progesterone  (PROMETRIUM ) 100 MG capsule   Other Visit Diagnoses       Encounter for gynecological examination with abnormal finding    -  Primary   GAD7 and PHQ9 reviewed. Annual labs. Has had flu shot.   Relevant Orders   CBC   TSH   Comprehensive metabolic panel with GFR   Hemoglobin A1c   Lipid panel     Screening for malignant neoplasm  of cervix       Relevant Orders   Cytology - PAP( [place])     Encounter for screening mammogram for malignant neoplasm of breast       Relevant Orders   MM 3D SCREENING MAMMOGRAM BILATERAL BREAST      Return in 1 year (on 04/13/2025).    See After Visit Summary for Counseling Recommendations

## 2024-04-14 ENCOUNTER — Ambulatory Visit: Payer: Self-pay | Admitting: Family Medicine

## 2024-04-14 LAB — HEMOGLOBIN A1C
Est. average glucose Bld gHb Est-mCnc: 111 mg/dL
Hgb A1c MFr Bld: 5.5 % (ref 4.8–5.6)

## 2024-04-14 LAB — CBC
Hematocrit: 43.3 % (ref 34.0–46.6)
Hemoglobin: 13.7 g/dL (ref 11.1–15.9)
MCH: 28.9 pg (ref 26.6–33.0)
MCHC: 31.6 g/dL (ref 31.5–35.7)
MCV: 91 fL (ref 79–97)
Platelets: 273 10*3/uL (ref 150–450)
RBC: 4.74 x10E6/uL (ref 3.77–5.28)
RDW: 12.3 % (ref 11.7–15.4)
WBC: 4.7 10*3/uL (ref 3.4–10.8)

## 2024-04-14 LAB — LIPID PANEL
Chol/HDL Ratio: 2.8 ratio (ref 0.0–4.4)
Cholesterol, Total: 269 mg/dL — ABNORMAL HIGH (ref 100–199)
HDL: 96 mg/dL
LDL Chol Calc (NIH): 161 mg/dL — ABNORMAL HIGH (ref 0–99)
Triglycerides: 75 mg/dL (ref 0–149)
VLDL Cholesterol Cal: 12 mg/dL (ref 5–40)

## 2024-04-14 LAB — COMPREHENSIVE METABOLIC PANEL WITH GFR
ALT: 24 [IU]/L (ref 0–32)
AST: 22 [IU]/L (ref 0–40)
Albumin: 4.9 g/dL (ref 3.9–4.9)
Alkaline Phosphatase: 76 [IU]/L (ref 49–135)
BUN/Creatinine Ratio: 20 (ref 12–28)
BUN: 14 mg/dL (ref 8–27)
Bilirubin Total: 0.3 mg/dL (ref 0.0–1.2)
CO2: 25 mmol/L (ref 20–29)
Calcium: 10 mg/dL (ref 8.7–10.3)
Chloride: 98 mmol/L (ref 96–106)
Creatinine, Ser: 0.69 mg/dL (ref 0.57–1.00)
Globulin, Total: 2.2 g/dL (ref 1.5–4.5)
Glucose: 81 mg/dL (ref 70–99)
Potassium: 4.4 mmol/L (ref 3.5–5.2)
Sodium: 138 mmol/L (ref 134–144)
Total Protein: 7.1 g/dL (ref 6.0–8.5)
eGFR: 99 mL/min/{1.73_m2}

## 2024-04-14 LAB — TSH: TSH: 2.56 u[IU]/mL (ref 0.450–4.500)

## 2024-04-21 ENCOUNTER — Ambulatory Visit
